# Patient Record
Sex: Female | Born: 1935 | Hispanic: No
Health system: Southern US, Community
[De-identification: ages and names within clinical notes are randomized; demographics above are authoritative.]

## PROBLEM LIST (undated history)

## (undated) DIAGNOSIS — D649 Anemia, unspecified: Secondary | ICD-10-CM

## (undated) DIAGNOSIS — M1990 Unspecified osteoarthritis, unspecified site: Secondary | ICD-10-CM

## (undated) DIAGNOSIS — J189 Pneumonia, unspecified organism: Secondary | ICD-10-CM

## (undated) DIAGNOSIS — J449 Chronic obstructive pulmonary disease, unspecified: Secondary | ICD-10-CM

## (undated) DIAGNOSIS — F419 Anxiety disorder, unspecified: Secondary | ICD-10-CM

## (undated) DIAGNOSIS — E785 Hyperlipidemia, unspecified: Secondary | ICD-10-CM

## (undated) DIAGNOSIS — F09 Unspecified mental disorder due to known physiological condition: Secondary | ICD-10-CM

---

## 2004-01-03 ENCOUNTER — Emergency Department (HOSPITAL_COMMUNITY): Admission: EM | Admit: 2004-01-03 | Discharge: 2004-01-03 | Payer: Self-pay | Admitting: Emergency Medicine

## 2005-07-17 ENCOUNTER — Encounter: Payer: Self-pay | Admitting: Emergency Medicine

## 2005-07-18 ENCOUNTER — Inpatient Hospital Stay (HOSPITAL_COMMUNITY): Admission: EM | Admit: 2005-07-18 | Discharge: 2005-07-23 | Payer: Self-pay | Admitting: Internal Medicine

## 2005-09-13 ENCOUNTER — Emergency Department (HOSPITAL_COMMUNITY): Admission: EM | Admit: 2005-09-13 | Discharge: 2005-09-14 | Payer: Self-pay | Admitting: Emergency Medicine

## 2006-05-07 ENCOUNTER — Inpatient Hospital Stay (HOSPITAL_COMMUNITY): Admission: EM | Admit: 2006-05-07 | Discharge: 2006-05-15 | Payer: Self-pay | Admitting: Emergency Medicine

## 2006-05-09 ENCOUNTER — Encounter (INDEPENDENT_AMBULATORY_CARE_PROVIDER_SITE_OTHER): Payer: Self-pay | Admitting: Interventional Cardiology

## 2007-09-12 ENCOUNTER — Ambulatory Visit: Payer: Self-pay | Admitting: Surgery

## 2007-09-12 ENCOUNTER — Emergency Department (HOSPITAL_COMMUNITY): Admission: EM | Admit: 2007-09-12 | Discharge: 2007-09-12 | Payer: Self-pay | Admitting: Emergency Medicine

## 2007-09-12 ENCOUNTER — Encounter (INDEPENDENT_AMBULATORY_CARE_PROVIDER_SITE_OTHER): Payer: Self-pay | Admitting: Emergency Medicine

## 2010-01-22 ENCOUNTER — Inpatient Hospital Stay (HOSPITAL_COMMUNITY): Admission: EM | Admit: 2010-01-22 | Discharge: 2010-01-24 | Payer: Self-pay | Admitting: Emergency Medicine

## 2011-02-12 LAB — COMPREHENSIVE METABOLIC PANEL
ALT: 20 U/L (ref 0–35)
AST: 36 U/L (ref 0–37)
Albumin: 2.5 g/dL — ABNORMAL LOW (ref 3.5–5.2)
Alkaline Phosphatase: 66 U/L (ref 39–117)
BUN: 19 mg/dL (ref 6–23)
CO2: 25 mEq/L (ref 19–32)
Calcium: 8.4 mg/dL (ref 8.4–10.5)
Chloride: 111 mEq/L (ref 96–112)
Creatinine, Ser: 0.82 mg/dL (ref 0.4–1.2)
GFR calc Af Amer: >60 mL/min (ref >60)
GFR calc non Af Amer: >60 mL/min (ref >60)
Glucose, Bld: 101 mg/dL — ABNORMAL HIGH (ref 70–99)
Potassium: 2.9 mEq/L — ABNORMAL LOW (ref 3.5–5.1)
Sodium: 142 mEq/L (ref 135–145)
Total Bilirubin: 0.6 mg/dL (ref 0.3–1.2)
Total Protein: 5.9 g/dL — ABNORMAL LOW (ref 6.0–8.3)

## 2011-02-12 LAB — CULTURE, BLOOD (ROUTINE X 2)
Culture: NO GROWTH
Culture: NO GROWTH

## 2011-02-12 LAB — DIFFERENTIAL
Basophils Absolute: 0 10*3/uL (ref 0.0–0.1)
Basophils Absolute: 0 10*3/uL (ref 0.0–0.1)
Basophils Relative: 0 % (ref 0–1)
Basophils Relative: 0 % (ref 0–1)
Eosinophils Absolute: 0 10*3/uL (ref 0.0–0.7)
Eosinophils Absolute: 0 10*3/uL (ref 0.0–0.7)
Eosinophils Relative: 0 % (ref 0–5)
Eosinophils Relative: 0 % (ref 0–5)
Lymphocytes Relative: 12 % (ref 12–46)
Lymphocytes Relative: 7 % — ABNORMAL LOW (ref 12–46)
Lymphs Abs: 0.7 10*3/uL (ref 0.7–4.0)
Lymphs Abs: 1 10*3/uL (ref 0.7–4.0)
Monocytes Absolute: 0.6 10*3/uL (ref 0.1–1.0)
Monocytes Absolute: 0.7 10*3/uL (ref 0.1–1.0)
Monocytes Relative: 7 % (ref 3–12)
Monocytes Relative: 8 % (ref 3–12)
Neutro Abs: 6.3 10*3/uL (ref 1.7–7.7)
Neutro Abs: 7.8 10*3/uL — ABNORMAL HIGH (ref 1.7–7.7)
Neutrophils Relative %: 80 % — ABNORMAL HIGH (ref 43–77)
Neutrophils Relative %: 85 % — ABNORMAL HIGH (ref 43–77)

## 2011-02-12 LAB — CBC
HCT: 31.3 % — ABNORMAL LOW (ref 36.0–46.0)
HCT: 32.3 % — ABNORMAL LOW (ref 36.0–46.0)
Hemoglobin: 10.5 g/dL — ABNORMAL LOW (ref 12.0–15.0)
Hemoglobin: 10.9 g/dL — ABNORMAL LOW (ref 12.0–15.0)
Hemoglobin: 11.4 g/dL — ABNORMAL LOW (ref 12.0–15.0)
MCHC: 34.5 g/dL (ref 30.0–36.0)
MCHC: 34.7 g/dL (ref 30.0–36.0)
MCHC: 35.1 g/dL (ref 30.0–36.0)
MCV: 94.6 fL (ref 78.0–100.0)
MCV: 95.1 fL (ref 78.0–100.0)
MCV: 95.1 fL (ref 78.0–100.0)
Platelets: 184 10*3/uL (ref 150–400)
Platelets: 201 10*3/uL (ref 150–400)
RBC: 3.21 MIL/uL — ABNORMAL LOW (ref 3.87–5.11)
RBC: 3.3 MIL/uL — ABNORMAL LOW (ref 3.87–5.11)
RBC: 3.42 MIL/uL — ABNORMAL LOW (ref 3.87–5.11)
RDW: 12.9 % (ref 11.5–15.5)
RDW: 13.1 % (ref 11.5–15.5)
WBC: 8 10*3/uL (ref 4.0–10.5)
WBC: 9.2 10*3/uL (ref 4.0–10.5)

## 2011-02-12 LAB — BASIC METABOLIC PANEL
BUN: 29 mg/dL — ABNORMAL HIGH (ref 6–23)
CO2: 24 mEq/L (ref 19–32)
CO2: 25 mEq/L (ref 19–32)
Calcium: 8.6 mg/dL (ref 8.4–10.5)
Chloride: 105 mEq/L (ref 96–112)
Chloride: 109 mEq/L (ref 96–112)
Creatinine, Ser: 0.94 mg/dL (ref 0.4–1.2)
GFR calc Af Amer: >60 mL/min (ref >60)
GFR calc Af Amer: >60 mL/min (ref >60)
GFR calc non Af Amer: 57 mL/min — ABNORMAL LOW (ref >60)
Glucose, Bld: 138 mg/dL — ABNORMAL HIGH (ref 70–99)
Potassium: 3.8 mEq/L (ref 3.5–5.1)
Sodium: 138 mEq/L (ref 135–145)
Sodium: 139 mEq/L (ref 135–145)

## 2011-02-12 LAB — LACTIC ACID, PLASMA: Lactic Acid, Venous: 1.1 mmol/L (ref 0.5–2.2)

## 2011-02-12 LAB — TSH: TSH: 1.661 u[IU]/mL (ref 0.350–4.500)

## 2011-02-12 LAB — PHOSPHORUS: Phosphorus: 2.2 mg/dL — ABNORMAL LOW (ref 2.3–4.6)

## 2011-02-12 LAB — MAGNESIUM: Magnesium: 2.1 mg/dL (ref 1.5–2.5)

## 2011-04-07 NOTE — H&P (Signed)
[name], [name]                 ACCOUNT NO.:  [id number]   MEDICAL RECORD NO.:  [id number]          PATIENT TYPE:  EMS   LOCATION:  ED                           FACILITY:  [facility]   PHYSICIAN:  Deirdre Peer. Polite, M.D. DATE OF BIRTH:  1922-10-15   DATE OF ADMISSION:  07/17/2005  DATE OF DISCHARGE:                                HISTORY & PHYSICAL   CHIEF COMPLAINT:  Per family, advanced dementia.   HISTORY OF PRESENT ILLNESS:  An 77 year old female with a history of  advanced dementia and osteoarthritis, who was brought to the ED by  paramedics after being found wandering.  The patient was without any obvious  injury.  In the ED, the patient was evaluated.  Had screening labs.  CBC,  point-of-care, CMET, and UA within normal limits.  The patient had a CT of  the head with questionable early evidence of left occipital CVA.  The  patient is unable to give any meaningful information secondary to any  advanced dementia; however, denies any fever, chills, nausea, vomiting.  No  diarrhea.  No constipation.  No chest pain.  No shortness of breath.  The  patient's daughter is present and confirms that the patient, other than her  advanced dementia, has not had any of the above complaints; however, the  patient's dementia seems to be advancing rather rapidly, and she currently  has poor safety awareness.  As stated today, has been found wandering.  This  is becoming more apparent and is happening with increasing frequency.  The  patient's daughter states that she is unable to care for her at home, and  will like assistance with placement secondary to her advanced dementia and  poor safety awareness.   PAST MEDICAL HISTORY:  As stated above.   MEDICATIONS:  None.   SOCIAL HISTORY:  Negative for tobacco, alcohol, or drugs.   PAST SURGICAL HISTORY:  Questionable broken wrist in the distant past.  Hysterectomy in the distant past.  Questionable for dysfunction uterine  bleeding.   ALLERGIES:  No known drug allergies.   FAMILY HISTORY:  Unavailable.   REVIEW OF SYSTEMS:  Reported as negative, as stated above in the HPI.   PHYSICAL EXAMINATION:  GENERAL:  The patient is alert in no apparent  distress.  Pleasantly confused.  VITAL SIGNS:  Temperature 98.5, BP 126/72, pulse 75, respiratory rate 18.  HEENT:  Within normal limits.  CHEST:  Clear to auscultation.  CARDIOVASCULAR:  Regular.  No S3.  ABDOMEN:  Soft and nontender.  No hepatosplenomegaly.  EXTREMITIES:  No edema.  Pulses 2+.  NEURO:  Grossly moves all extremities.  Extraocular muscles are intact.  Motor 5/5.  Gait not tested.  As stated, grossly intact.   DATA:  CBC within normal limits.  Point-of-care enzymes within normal  limits.  CMET within normal limits.   CT of the head:  Early evidence of left occipital CVA with microvascular  disease.  This is a questionable result of early evidence of left occipital  CVA.   ASSESSMENT:  1.  Advanced dementia.  2.  Poor  safety awareness.  3.  Osteoarthritis.  4.  Abnormal CT scan of the head, questionable early occipital      cerebrovascular accident and microvascular disease.   Recommend patient be admitted to a medicine floor bed.  Will send labs for  reversible causes of dementia and will start plans for placement.  As the  patient has an abnormal CT showing questionable early evidence of occipital  CVA.  Will obtain an MRI/MRA of the brain.  Will make further  recommendations after review of the above studies.      Deirdre Peer. Polite, M.D.  Electronically Signed     RDP/MEDQ  D:  07/17/2005  T:  07/17/2005  Job:  [id number]

## 2011-04-07 NOTE — Discharge Summary (Signed)
NAMESHANEQUE, MERKLE                 ACCOUNT NO.:  [id number]   MEDICAL RECORD NO.:  [id number]          PATIENT TYPE:  INP   LOCATION:  6703                         FACILITY:  [facility]   PHYSICIAN:  Melissa L. Ladona Ridgel, MD  DATE OF BIRTH:  1922-06-04   DATE OF ADMISSION:  07/18/2005  DATE OF DISCHARGE:  07/22/2005                                 DISCHARGE SUMMARY   DISCHARGING DIAGNOSES:  1.  End-stage dementia with wandering.  The patient obviously is not able to      care for herself at home or remain unsupervised for any length of time.      We therefore have made arrangements for transfer to a secured facility      for monitoring and care on a 24-hour basis.  2.  Osteoarthritis.  The patient has made no complaints about her hands or      other joints, although today she does state that she has mild hip      discomfort.  I suspect that this is possibly more related to      constipation as the patient cannot express fully what she means by      discomfort.  We will attempt to assess the situation and correct any      obvious dysfunction.   MEDICATIONS AT THE TIME OF DISCHARGE:  1.  Aspirin 81 mg p.o. daily.  2.  Tylenol 650 mg p.o. q.6h. p.r.n.  3.  Haldol 2.5 mg IV or IM or p.o. q.4h. p.r.n. for agitation.   HISTORY OF PRESENT ILLNESS:  This is an 77 year old white female with a  history of advanced dementia and osteoarthritis, who was found wandering in  the community unsupervised.  The patient had been living alone with family  assistance, but at this time the family can no longer care for her safely at  home.   She was brought to the emergency room, evaluated for exacerbating factors  which may have caused her to wander, namely a head CT was completed as was  an MRI/MRA to assure that she had not had any stroke, and no obvious stroke  was located.  A chest x-ray was completed, which showed no active disease.  A urinalysis was obtained with no obvious signs for infection.  A  dementia  laboratory panel was done with no obvious abnormalities being noted.  The  patient tolerated the hospital stay well without any complicating factors.  It appears that her only difficulty is very advanced end-stage dementia.   PHYSICAL EXAMINATION:  VITAL SIGNS:  On the day prior to discharge, her  temperature was 97.4, blood pressure 141/75, pulse 88, respirations 16, O2  saturation 96%.  GENERAL:  This is a well-developed, well-nourished white female in no acute  distress.  HEENT:  Pupils equal, round, and reactive to light.  Extraocular muscles are  intact.  Mucous membranes are moist.  NECK:  Supple.  There is no JVD, no lymph nodes and no carotid bruit.  CHEST:  Clear to auscultation.  There are no rhonchi, rales or wheezes.  CARDIOVASCULAR:  Regular rate  and rhythm, positive S1, S2, no S3, S4, no  murmurs, rubs or gallops.  ABDOMEN:  Soft, nontender, nondistended, with positive bowel sounds.  EXTREMITIES:  No clubbing, cyanosis, or edema.  NEUROLOGIC:  The patient is awake but not oriented to time, place or person.  She does like to participate in folding of towels and is easily distracted  with minor activities with her hands.  Her power is 5/5.  DTRs are 2+.  She  does have mild to moderate gait dysfunction that our physical therapist felt  would be assisted with a rolling walker.   As stated, pertinent laboratory values are as follows:  TSH is 1.852.  RPR  is nonreactive.  Folic acid is 09.8.  B12 is 285.  Urinalysis is negative.  Cardiac markers were negative.  On admission, her sodium was 142, potassium  4.3, chloride 104, CO2 30, BUN 27, creatinine 0.8.  LFTs are within normal  limits.  Her CBC was 6.0 with a hemoglobin of 12.2 and hematocrit 35.8,  platelets of 291.   At this time, the patient is deemed stable for transfer to a locked  dementia unit if available, and she may be transferred when a bed becomes  available.      Melissa L. Ladona Ridgel, MD   Electronically Signed     MLT/MEDQ  D:  07/21/2005  T:  07/21/2005  Job:  [id number]

## 2011-04-07 NOTE — Discharge Summary (Signed)
[name], [name]                 ACCOUNT NO.:  [id number]   MEDICAL RECORD NO.:  [id number]          PATIENT TYPE:  INP   LOCATION:  5741                         FACILITY:  [facility]   PHYSICIAN:  Ollen Gross, M.D.    DATE OF BIRTH:  [birth year]   DATE OF ADMISSION:  05/07/2006  DATE OF DISCHARGE:  05/14/2006                                 DISCHARGE SUMMARY   ADMISSION DIAGNOSES:  1.  Displaced left femoral neck fracture.  2.  End-stage dementia.  3.  Osteoarthritis.  4.  History of risk fracture.  5.  History of hypercholesterolemia.  6.  History of leg fracture.   DISCHARGE DIAGNOSES:  1.  Displaced left femoral neck fracture status post left hip      hemiarthroplasty.  2.  Postoperative blood loss anemia.  3.  Status post transfusion without sequelae.  4.  Postoperative hyponatremia.  5.  End-stage dementia.  6.  Osteoarthritis.  7.  History of risk fracture.  8.  History of hypercholesterolemia.  9.  History of leg fracture.   PROCEDURE:  May 09, 2006, left hip hemiarthroplasty.   SURGEON:  Ollen Gross, M.D.   ASSISTANT:  Alexzandrew L. Julien Girt, P.A.   ANESTHESIA:  General.   CONSULTATIONS:  [facility], Dr. Derenda Mis.   BRIEF HISTORY:  [name] is an 77 year old female who sustained a fall in a  nursing home prior to admission. Unfortunately, she got up, injuring her  left hip. She was brought to [facility] where x-rays showed a femoral neck  fracture. The patient was seen and admitted. Findings were discussed with  the family. She was admitted for bedrest and preoperative evaluation.   LABORATORY DATA:  CBC on admission:  Hemoglobin 12.5, hematocrit 35.5, white  cell count 5.9, differential within normal limits. Hemoglobin dropped down  to 10.9 to 10.0 on the date of surgery, drifted down to 9.1, then down to  8.5; given 2 units of blood. Followup hemoglobin 11.4. PT/PTT on admission  11.4 and 28, respectively; INR 0.8. Chem panel on admission:   Elevated BUN  of 25, low albumin of 3.4, elevated glucose 153; remaining Chem panel within  normal limits. Serial BMETs were followed; sodium dropped from 136 to 133,  stabilizing 132; glucose came down to 106. Cardiac enzymes taken, first set  on June 20:  CK 159, CK-MB 2.8, relative index 1.8, troponin 0.22; second  set taken on June 20:  CK 143, CK-MB 2.6, relative index 1.1, troponin 0.18;  third set on June 20:  CK 133, CK-MB 2.2, relative 1.7, troponin continues  to improve to 0.15. Preoperative UA:  Many bacteria, rare epithelial, small  leukocyte esterase, 3 to 6 white cells, 0 to 2 red cells, rare yeast. Urine  culture:  Escherichia coli, pan sensitive.   X-rays:  Portable chest May 07, 2006:  No active cardiopulmonary disease,  marked chronic interstitial changes. Left hip films:  Impacted mildly  displaced subcapital left femoral neck fracture. EKG May 09, 2006:  Sinus  rhythm, possible premature atrial complexes with premature ventricular  complexes , left axis  deviation, low-voltage QRS. When compared with  previous, no change, confirmed by Dr. Olga Millers. Other EKG May 07, 2006, sinus rhythm with sinus arrhythmia with occasional premature  ventricular complexes, left axis deviation, complete right bundle branch  block .   HOSPITAL COURSE:  The patient was admitted to [facility], placed on  bedrest. Hospital consult called in to [facility]. The patient was  seen and evaluated. She was placed in Buck's traction for comfort. The  patient had UTI which was found during the hospital course and was placed on  empiric antibiotics. Once the patient had been seen and cleared, she was  preopped, and then she was taken to the operating room on May 09, 2006  where she underwent the above stated procedure without difficulty. P.o. and  IV analgesics were placed. She was continued on postoperative IV antibiotics  for 24 hours. By day 1, she was doing pretty well,  resting comfortably,  easily arousable. Hemoglobin was 10.9. No drain was used. She did have  cardiac enzymes taken postoperatively which had elevation in the troponin.  However, the cardiac markers were all within normal limits. The troponins  did improve with each serial set. By day 2, the patient was resting  comfortably, doing well, dressing changed. Incision was healing well.  Supplemented potassium for borderline hypokalemia. IVs were heplocked.  Dementia was stable. She was using her Seroquel. She continued on Cipro for  the UTI. Foley was removed. Over the weekend, she did well.  No obvious  distress. Pleasantly confused. She had an episode of mild hypotension. Lasix  was held, along with metoprolol, given a slight bolus. Hemoglobin was noted  to be low on Sunday of 8.5. She was given 2 units of blood, tolerated the  transfusion well. Hemoglobin came back up. She was up to 11.4 after 2 units.  It was felt on Monday, June 25, that she was more than likely ready for  discharge. Discharge planning were working with the patient. They were  trying to contact the family for bed offers and wanting on bed placement.   DISCHARGE PLAN:  1.  The patient's possible tentative discharge on May 14, 2006.  2.  Please see above.   DISCHARGE MEDICATIONS:  Current medications include:  1.  Lasix 20 mg p.o. daily.  2.  Seroquel 25 mg p.o. nightly.  3.  Lopressor 12.5 mg p.o. b.i.d.  4.  Altace 2.5 mg p.o. daily.  5.  Multivitamin daily.  6.  Cipro 250 mg p.o. b.i.d.; continue until May 19, 2006 and then      discontinue Cipro.  7.  Ativan 0.5 mg p.o. q.8h. p.r.n. agitation.  8.  Restoril 15 mg p.o. nightly p.r.n. sleep.  9.  Vicodin 5 mg 1 or 2 every 4 to 6 hours as needed for pain.  10. Robaxin 500 mg p.o. q.6h. p.r.n. spasm.  11. Zofran 4 mg p.o. q.6h. p.r.n. nausea.  12. Senokot S p.o. nightly p.r.n. 13. Lovenox 40 mg subcu daily for 6 more days following discharge and then       discontinue.   DIET:  As tolerated.   ACTIVITY:  She can be weight bearing as tolerated to the left lower  extremity, hip precautions. She only needs a knee immobilizer in bed at  night. May have the knee immobilized or off when she is up walking or  sitting in a chair. Up out of bed minimal b.i.d. Gait training, ambulation,  ADLs, PT, and  OT. Needs to follow up in the office 2 weeks from surgery.  Please contact the office at [phone] to arrange an appointment time and  transfer of the patient.   DISPOSITION:  Pending bed offers.   CONDITION ON DISCHARGE:  Pending until final disposition .      Alexzandrew L. Julien Girt, P.A.      Ollen Gross, M.D.  Electronically Signed    ALP/MEDQ  D:  05/14/2006  T:  05/14/2006  Job:  [id number]   cc:   Bryan Lemma. Manus Gunning, M.D.  Fax: [phone]   Melissa L. Ladona Ridgel, MD

## 2011-04-07 NOTE — Discharge Summary (Signed)
[name], HOHEISEL                 ACCOUNT NO.:  [id number]   MEDICAL RECORD NO.:  [id number]          PATIENT TYPE:  INP   LOCATION:  5741                         FACILITY:  [facility]   PHYSICIAN:  Melissa L. Ladona Ridgel, MD  DATE OF BIRTH:  [birth year]   DATE OF ADMISSION:  05/07/2006  DATE OF DISCHARGE:  05/14/2006                                 DISCHARGE SUMMARY   ADDENDUM:  Please see the previously-dictated discharge summary by the  physician assistant for Dr. Lequita Halt and note the following changes, which  have been handwritten on the discharge copy:   1.  The patient's Lasix is 20 mg p.o. daily.  I would hold this if her blood      pressure is less than 100 systolic.  2.  Her Lopressor, I would discontinue this as her blood pressure remains      too low to support its therapy.  3.  The Altace 2.5 mg p.o. daily, I would hold this for systolic blood      pressures less than 100 and if she continues to be with low blood      pressures in the low 100s, I would discontinue it altogether.   I will contact Dr. Lequita Halt and let him know of the changes, and at this time  the patient is stable status post 2 units of packed red blood cells with a  hemoglobin of 11.4 today.  It is okay with me if she discharges to the  skilled nursing facility as long as these changes to her medications are  noted.      Melissa L. Ladona Ridgel, MD  Electronically Signed     MLT/MEDQ  D:  05/14/2006  T:  05/14/2006  Job:  [id number]   cc:   Bryan Lemma. Manus Gunning, M.D.  Fax: [phone]   Ollen Gross, M.D.  Fax: [phone]

## 2011-04-07 NOTE — Op Note (Signed)
NAMEDELLIA, DONNELLY                 ACCOUNT NO.:  [id number]   MEDICAL RECORD NO.:  [id number]          PATIENT TYPE:  INP   LOCATION:  5741                         FACILITY:  [facility]   PHYSICIAN:  Ollen Gross, M.D.    DATE OF BIRTH:  May 11, 1922   DATE OF PROCEDURE:  05/09/2006  DATE OF DISCHARGE:                                 OPERATIVE REPORT   PREOPERATIVE DIAGNOSIS:  Displaced left femoral neck fracture.   POSTOP DIAGNOSIS:  Displaced left femoral neck fracture.   PROCEDURE:  Left hip hemiarthroplasty.   SURGEON:  Ollen Gross, M.D.   ASSISTANT:  Alexzandrew L. Julien Girt, P.A.   ANESTHESIA:  General.   ESTIMATED BLOOD LOSS:  100   DRAINS:  None.   COMPLICATIONS:  None.   CONDITION:  Stable to respiratory rate.   INDICATIONS FOR PROCEDURE:  Ms. [name] is an 77 year old female who had fall  in nursing home 2 days ago sustaining a displaced left femoral neck  fracture.  She presents now for left hip hemiarthroplasty.   PROCEDURE IN DETAIL:  After the successful initiation of general anesthetic,  the patient's placed a right lateral decubitus position with the left side  up and held with the hip positioner.  Left lower extremity isolated from her  perineum with plastic drapes and prepped and draped in usual sterile  fashion.  Short posterolateral incision made with 10 blade through  subcutaneous tissue to the level of the fascia lata which was incised in  line with the skin incision.  Sciatic nerve is palpated and protected and  short rotators isolated off the femur.  Capsulotomy was performed and  fracture hematoma identified.  Femoral head is identified and removed.  Measured 47 mm in diameter.  This was a high subcapital fracture.  I took an  oscillating saw and then resected the femoral neck at the appropriate level  and with the appropriate inclination.  We then used the canal finder to gain  access to the femoral canal and thoroughly irrigated the canal.  We  started  broaching at size 1 for the Summit basic fracture stem.  First up to a size  5 for a size 5 Press-Fit stem.  We had excellent torsional and axial  stability with this.  The broaches removed and the size 5 Summit basic Press-  Fit implant was impacted into the femoral canal with excellent torsional and  axial stability.  A trial 1.5 head with a 47 bipolar is placed.  Hip was  reduced with great stability throughout.  Excellent suction fit with the  bipolar component into the acetabulum.  The trials were removed and the  permanent 28 plus 1.5 head and the permanent 47 bipolar were placed.  The  hip was reduced with great stability.  Wound was copiously irrigated with  saline solution and rotators and capsule reattached to the femur with  Ethibond suture.  Fascia lata was closed with #1 Vicryl, subcu closed with #1-0 and #2-0  Vicryl, subcuticular running 4-0 Monocryl.  Incisions cleaned and dried and  Steri-Strips and bulky sterile dressing applied.  She is placed in a knee  immobilizer, awakened, transferred to recovery in stable condition.      Ollen Gross, M.D.  Electronically Signed     FA/MEDQ  D:  05/09/2006  T:  05/10/2006  Job:  [id number]

## 2011-05-25 ENCOUNTER — Emergency Department (HOSPITAL_COMMUNITY): Payer: Medicare HMO

## 2011-05-25 ENCOUNTER — Inpatient Hospital Stay (HOSPITAL_COMMUNITY): Payer: Medicare HMO

## 2011-05-25 ENCOUNTER — Inpatient Hospital Stay (HOSPITAL_COMMUNITY)
Admission: EM | Admit: 2011-05-25 | Discharge: 2011-05-31 | DRG: 470 | Disposition: A | Discharge status: Skilled Nursing Facility | Payer: Medicare HMO | Attending: Family Medicine | Admitting: Family Medicine

## 2011-05-25 DIAGNOSIS — E785 Hyperlipidemia, unspecified: Secondary | ICD-10-CM | POA: Diagnosis present

## 2011-05-25 DIAGNOSIS — G309 Alzheimer's disease, unspecified: Secondary | ICD-10-CM | POA: Diagnosis present

## 2011-05-25 DIAGNOSIS — F0280 Dementia in other diseases classified elsewhere without behavioral disturbance: Secondary | ICD-10-CM | POA: Diagnosis present

## 2011-05-25 DIAGNOSIS — A498 Other bacterial infections of unspecified site: Secondary | ICD-10-CM | POA: Diagnosis not present

## 2011-05-25 DIAGNOSIS — F411 Generalized anxiety disorder: Secondary | ICD-10-CM | POA: Diagnosis present

## 2011-05-25 DIAGNOSIS — S72009A Fracture of unspecified part of neck of unspecified femur, initial encounter for closed fracture: Principal | ICD-10-CM | POA: Diagnosis present

## 2011-05-25 DIAGNOSIS — D649 Anemia, unspecified: Secondary | ICD-10-CM | POA: Diagnosis present

## 2011-05-25 DIAGNOSIS — M1990 Unspecified osteoarthritis, unspecified site: Secondary | ICD-10-CM | POA: Diagnosis present

## 2011-05-25 DIAGNOSIS — W19XXXA Unspecified fall, initial encounter: Secondary | ICD-10-CM | POA: Diagnosis present

## 2011-05-25 DIAGNOSIS — N390 Urinary tract infection, site not specified: Secondary | ICD-10-CM | POA: Diagnosis not present

## 2011-05-25 DIAGNOSIS — E860 Dehydration: Secondary | ICD-10-CM | POA: Diagnosis present

## 2011-05-25 DIAGNOSIS — Y9210 Unspecified residential institution as the place of occurrence of the external cause: Secondary | ICD-10-CM | POA: Diagnosis present

## 2011-05-25 LAB — URINALYSIS, ROUTINE W REFLEX MICROSCOPIC
Bilirubin Urine: NEGATIVE
Glucose, UA: NEGATIVE mg/dL
Hgb urine dipstick: NEGATIVE
Ketones, ur: NEGATIVE mg/dL
Nitrite: NEGATIVE
Protein, ur: NEGATIVE mg/dL
Specific Gravity, Urine: 1.024 (ref 1.005–1.030)
Urobilinogen, UA: 0.2 mg/dL (ref 0.0–1.0)
pH: 5 (ref 5.0–8.0)

## 2011-05-25 LAB — CBC
HCT: 37.3 % (ref 36.0–46.0)
HCT: 35.2 % — ABNORMAL LOW (ref 36.0–46.0)
Hemoglobin: 12.9 g/dL (ref 12.0–15.0)
Hemoglobin: 12.1 g/dL (ref 12.0–15.0)
MCH: 31.7 pg (ref 26.0–34.0)
MCH: 31.3 pg (ref 26.0–34.0)
MCHC: 34.6 g/dL (ref 30.0–36.0)
MCHC: 34.4 g/dL (ref 30.0–36.0)
MCV: 91.6 fL (ref 78.0–100.0)
MCV: 91.2 fL (ref 78.0–100.0)
Platelets: 224 10*3/uL (ref 150–400)
RBC: 4.07 MIL/uL (ref 3.87–5.11)
RDW: 13 % (ref 11.5–15.5)
WBC: 7.7 10*3/uL (ref 4.0–10.5)

## 2011-05-25 LAB — DIFFERENTIAL
Basophils Absolute: 0 10*3/uL (ref 0.0–0.1)
Basophils Relative: 0 % (ref 0–1)
Eosinophils Absolute: 0.1 10*3/uL (ref 0.0–0.7)
Eosinophils Relative: 1 % (ref 0–5)
Lymphocytes Relative: 15 % (ref 12–46)
Lymphocytes Relative: 5 % — ABNORMAL LOW (ref 12–46)
Lymphs Abs: 1.1 10*3/uL (ref 0.7–4.0)
Lymphs Abs: 0.5 10*3/uL — ABNORMAL LOW (ref 0.7–4.0)
Monocytes Absolute: 0.5 10*3/uL (ref 0.1–1.0)
Monocytes Absolute: 0.6 10*3/uL (ref 0.1–1.0)
Monocytes Relative: 7 % (ref 3–12)
Monocytes Relative: 6 % (ref 3–12)
Neutro Abs: 6 10*3/uL (ref 1.7–7.7)
Neutro Abs: 8.8 10*3/uL — ABNORMAL HIGH (ref 1.7–7.7)
Neutrophils Relative %: 78 % — ABNORMAL HIGH (ref 43–77)

## 2011-05-25 LAB — BASIC METABOLIC PANEL
BUN: 27 mg/dL — ABNORMAL HIGH (ref 6–23)
GFR calc non Af Amer: >60 mL/min (ref >60)
Glucose, Bld: 145 mg/dL — ABNORMAL HIGH (ref 70–99)
Potassium: 3.9 mEq/L (ref 3.5–5.1)

## 2011-05-25 LAB — APTT: aPTT: 32 s (ref 24–37)

## 2011-05-25 LAB — URINE MICROSCOPIC-ADD ON

## 2011-05-25 LAB — PROTIME-INR: Prothrombin Time: 13.2 seconds (ref 11.6–15.2)

## 2011-05-26 LAB — COMPREHENSIVE METABOLIC PANEL
Albumin: 2.7 g/dL — ABNORMAL LOW (ref 3.5–5.2)
BUN: 23 mg/dL (ref 6–23)
Chloride: 112 mEq/L (ref 96–112)
Creatinine, Ser: 0.65 mg/dL (ref 0.50–1.10)
GFR calc Af Amer: >60 mL/min (ref >60)
Glucose, Bld: 113 mg/dL — ABNORMAL HIGH (ref 70–99)
Total Bilirubin: 0.4 mg/dL (ref 0.3–1.2)
Total Protein: 5.5 g/dL — ABNORMAL LOW (ref 6.0–8.3)

## 2011-05-26 LAB — CBC
HCT: 27.6 % — ABNORMAL LOW (ref 36.0–46.0)
Hemoglobin: 9.4 g/dL — ABNORMAL LOW (ref 12.0–15.0)
MCH: 31.3 pg (ref 26.0–34.0)
MCHC: 34.1 g/dL (ref 30.0–36.0)
MCV: 92 fL (ref 78.0–100.0)
Platelets: 163 10*3/uL (ref 150–400)
RBC: 3 MIL/uL — ABNORMAL LOW (ref 3.87–5.11)
RDW: 13.2 % (ref 11.5–15.5)
WBC: 6.1 10*3/uL (ref 4.0–10.5)

## 2011-05-26 NOTE — H&P (Signed)
NAMEMarland Kitchen  NEYLAN, [name] NO.:  [id number]  MEDICAL RECORD NO.:  [id number]  LOCATION:  5021                         FACILITY:  [facility]  PHYSICIAN:  Standley Dakins, MD   DATE OF BIRTH:  August 05, 1922  DATE OF ADMISSION:  05/25/2011 DATE OF DISCHARGE:                             HISTORY & PHYSICAL   PRIMARY CARE PHYSICIAN:  The attending physician at [facility], [address] [place], [place].  HISTORIAN:  Emergency room physician and records from NH.  CHIEF COMPLAINT:  Fractured right femur.  HISTORY OF PRESENT ILLNESS:  This patient is a severely demented 77 year old female resident of the [facility], who was sent to the emergency department with a chief complaint of fall.  The nursing home report provided information that the patient suffered an unwitnessed fall and appear to be grabbing her right thigh.  She unfortunately is completely nonverbal and so no history can be obtained from the patient.  The fall apparently occurred today.  She was at the nursing home.  There was no associated loss of consciousness reported. Symptoms have been described as mild according to witness reports from the nursing home.  The patient was seen in the emergency department and found to have a fractured right femur.  The patient has a past medical history significant for hyperlipidemia, osteoarthritis, anemia, dementia, and pneumonia.  Hospital admission was requested for further treatment and orthopedic consultation.  The patient was seen by the orthopedist and they plan on taking her to the operating room in the near future for treatment.  PAST MEDICAL HISTORY: 1. Anxiety disorder. 2. Anemia. 3. Dementia. 4. Dermatitis. 5. Osteoarthritis. 6. Hyperlipidemia. 7. History of previous hip fracture. 8. History of dysphagia. 9. Cerebrovascular disease and microvascular disease.  MEDICATIONS:  From home, 1. Promethazine 25 mg one p.o. q.6h.  p.r.n. 2. Ativan 0.5 mg one p.o. every 8 hours as needed. 3. Hydrocodone/acetaminophen 5/500 one p.o. q.6h. p.r.n. 4. Seroquel 25 mg one p.o. daily at bedtime. 5. Vitamin C 500 mg one p.o. twice daily. 6. Healthy Shakes 1 can twice daily. 7. Multivitamins one p.o. daily. 8. Furosemide 20 mg one p.o. daily.  ALLERGIES:  No known drug allergies.  FAMILY HISTORY:  Unable to obtain.  SOCIAL HISTORY:  This patient is currently a resident of the [facility] and requires assistance with all of her activities of daily living.  The patient is nonverbal and severely demented.  No history of tobacco, alcohol, or recreational drug use reported.  REVIEW OF SYSTEMS:  Significant for pain in the right femur, confusion, dry mouth, otherwise unable to obtain because of the patient's severe dementia.  PHYSICAL EXAMINATION:  VITAL SIGNS:  Temperature 98.1, pulse 91, respirations 20, blood pressure 147/71, pulse ox 97% on room air. GENERAL:  This is an elderly female.  She is lying in bed.  She is in no apparent distress.  She is nonverbal, but nongrimacing. HEENT:  Normocephalic, atraumatic.  Dry mucous membranes noted. NECK:  Supple.  No lymphadenopathy or JVD.  Thyroid soft.  No nodules or masses palpated. LUNGS:  Bilateral breath sounds, clear to auscultation.  No crackles, wheezes, or rhonchi heard. CARDIAC:  Normal S1 and S2 sounds without murmurs, rubs, or gallops. ABDOMEN:  Soft, nondistended, nontender.  No masses palpated.  No hepatosplenomegaly, guarding, or rebound tenderness noted. EXTREMITIES:  The patient is in traction in the right lower extremity. No edema noted on the left lower extremity or the right lower extremity. NEUROLOGIC:  No focal deficits. PSYCHIATRIC:  The patient has severe dementia and is nonverbal at this time.  LABORATORY DATA:  Urinalysis reveals 11-20 white blood cells per high- power field and many bacteria.  Cloudy urine noticed.  Type and  screen positive.  Sodium 143, potassium 3.9, chloride 106, bicarb 23, BUN 27, creatinine 0.82, calcium 9.7.  PTT 32, PT 13.2, INR 0.98.  White blood cell count 7.7, hemoglobin 12.9, hematocrit 37.3, platelet count 224.  IMAGING STUDIES:  Chest x-ray, no acute findings noted and stable exam. Right femur x-ray reveals a right femoral neck fracture.  IMPRESSION: 1. Acute right femoral neck fracture. 2. Dehydration. 3. Urinary tract infection. 4. Severe advanced dementia. 5. History of hyperlipidemia. 6. Anxiety disorder.  PLAN: 1. The patient is going to be admitted into the hospital for further     management.  I talked with the orthopedic specialist, who may be     taking her to the OR in the next several hours, if not today then     tomorrow. 2. Encourage IV fluids and treat with Rocephin for the urinary tract     infection, culture urine. 3. Monitor hemoglobin closely. 4. Hydrocodone for pain as needed. 5. Ativan for agitation, anxiety. 6. Please see orthopedic consultation for further information     regarding her surgery.     Standley Dakins, MD     CJ/MEDQ  D:  05/25/2011  T:  05/25/2011  Job:  [id number]  Electronically Signed by Standley Dakins  on 05/26/2011 06:04:35 PM

## 2011-05-27 LAB — CBC
HCT: 23.5 % — ABNORMAL LOW (ref 36.0–46.0)
MCH: 31.5 pg (ref 26.0–34.0)
MCV: 91.4 fL (ref 78.0–100.0)
Platelets: 129 10*3/uL — ABNORMAL LOW (ref 150–400)
RBC: 2.57 MIL/uL — ABNORMAL LOW (ref 3.87–5.11)
WBC: 6.5 10*3/uL (ref 4.0–10.5)

## 2011-05-27 LAB — BASIC METABOLIC PANEL
BUN: 15 mg/dL (ref 6–23)
CO2: 23 mEq/L (ref 19–32)
Calcium: 8 mg/dL — ABNORMAL LOW (ref 8.4–10.5)
Chloride: 110 mEq/L (ref 96–112)
Creatinine, Ser: 0.59 mg/dL (ref 0.50–1.10)
Glucose, Bld: 120 mg/dL — ABNORMAL HIGH (ref 70–99)

## 2011-05-27 LAB — URINE CULTURE

## 2011-05-28 LAB — CBC
MCH: 31.9 pg (ref 26.0–34.0)
Platelets: 132 10*3/uL — ABNORMAL LOW (ref 150–400)
RBC: 2.38 MIL/uL — ABNORMAL LOW (ref 3.87–5.11)
RDW: 13.3 % (ref 11.5–15.5)

## 2011-05-28 LAB — BASIC METABOLIC PANEL
BUN: 11 mg/dL (ref 6–23)
CO2: 24 mEq/L (ref 19–32)
Calcium: 8.2 mg/dL — ABNORMAL LOW (ref 8.4–10.5)
Creatinine, Ser: 0.63 mg/dL (ref 0.50–1.10)
GFR calc non Af Amer: >60 mL/min (ref >60)
Glucose, Bld: 107 mg/dL — ABNORMAL HIGH (ref 70–99)
Sodium: 140 mEq/L (ref 135–145)

## 2011-05-28 LAB — PREPARE RBC (CROSSMATCH)

## 2011-05-29 LAB — TYPE AND SCREEN: Unit division: 0

## 2011-05-30 LAB — HEMOGLOBIN AND HEMATOCRIT, BLOOD: Hemoglobin: 8.9 g/dL — ABNORMAL LOW (ref 12.0–15.0)

## 2011-05-30 NOTE — Discharge Summary (Signed)
NAMEBRIE, EPPARD NO.:  [id number]  MEDICAL RECORD NO.:  [id number]  LOCATION:  5021                         FACILITY:  [facility]  PHYSICIAN:  Standley Dakins, MD   DATE OF BIRTH:  1922-09-22  DATE OF ADMISSION:  05/25/2011 DATE OF DISCHARGE:                        DISCHARGE SUMMARY - REFERRING   ANTICIPATED DATE OF DISCHARGE:  May 31, 2011.  DISCHARGE DIAGNOSES: 1. Right hip fracture status post recent right hip hemiarthroplasty. 2. Status post remote left hip hemiarthroplasty. 3. Severe advanced dementia. 4. Anxiety disorder. 5. Anemia. 6. Osteoarthritis. 7. Hyperlipidemia. 8. Cerebrovascular disease and microvascular disease. 9. Escherichia coli urinary tract infection. 10.History of dermatitis.  DISCHARGE MEDICATIONS:  Please see discharge medication reconciliation form.  HOSPITAL COURSE:  Briefly, this patient is an 77 year old female resident of the [facility], who was sent to the emergency department with a chief complaint of fall.  Unfortunately, she had suffered an unwitnessed fall and appeared to be grabbing at her right thigh.  She is essentially nonverbal and has severe advanced dementia and was grabbing at the right thigh.  In the emergency department, x- rays revealed that the patient had a fracture at the right femoral neck and superolateral dislocation of the distal fracture component noted on the x-ray.  The femoral head remained located within the acetabulum. The patient was seen by the orthopedic consult, Dr. Ave Filter who performed right hip hemiarthroplasty on May 25, 2011.  The patient tolerated the procedure very well.  We monitored her closely and noted that she had a drop in hemoglobin to less than 7 and I transfused her with 1 unit of packed red blood cells.  After the transfusion, her hemoglobin has remained relatively stable with a current hemoglobin of 8.9.    The patient had received Ativan several times  for agitation and experienced confusion, but overall has done very well considering her advanced dementia in the hospital.  She has tried to work with physical therapy.  She has been ambulating, but her ambulation and progress is limited by her cognitive status and it hinders mobility in that regard. She is a fall risk and will remain a fall risk because of her cognitive status.  The rest of her hospitalization has been awaiting placement in skilled nursing facility.    She could not go back to [facility] because she required more care than they are able to provide for her at this time.    She will need to continue physical therapy at the skilled nursing facility and she will follow up with her orthopedic specialist, Dr. Ave Filter in 10-14 days after the procedure which was done on May 25, 2011.  DISCHARGE CONDITION:  Stable.  DISPOSITION:  Discharged to skilled nursing facility.  ACTIVITY:  Physical therapy recommended, fall precautions recommended, and dementia precautions.  DIET:  Resume previous diet.  FOLLOWUP:  Follow with Dr. Ave Filter in 10-14 days post procedure. Follow up with the attending physician at the skilled nursing facility.  I spent 40 mins preparing discharge including reviewing records and  consultation notes, arranging care with care managers and dictating.   Standley Dakins, MD     CJ/MEDQ  D:  05/30/2011  T:  05/30/2011  Job:  [id number]  Electronically Signed by Standley Dakins  on 05/30/2011 07:16:40 PM

## 2011-06-13 NOTE — Consult Note (Signed)
[name], [name] NO.:  [id number]  MEDICAL RECORD NO.:  [id number]  LOCATION:  5021                         FACILITY:  [facility]  PHYSICIAN:  Jones Broom, MD    DATE OF BIRTH:  [birth year]  DATE OF CONSULTATION:  05/25/2011 DATE OF DISCHARGE:                                CONSULTATION   REASON FOR CONSULTATION:  Evaluation of right hip fracture.  HISTORY OF PRESENT ILLNESS:  [name] is a severely demented 77 year old female who is a total care in nursing home.  She reportedly had a fall this morning and had increased complaint of right hip pain.  She was seen in the [facility] Emergency Department where x-rays revealed displaced right femoral neck fracture.  I was consulted for evaluation and management.  She has been admitted to [facility].  She is essentially unable to communicate given her severe dementia.  There is no report of other injuries with the fall.  She overall seems fairly comfortable at this point.  PAST MEDICAL HISTORY:  Reviewed in Dr. Henriette Combs preoperative H and P and includes anxiety disorder, anemia, dementia, dermatitis, osteoarthritis, hyperlipidemia, history of previous hip fracture, history of dysphagia, cerebrovascular disease, and microvascular disease.  MEDICATIONS: 1. Promethazine. 2. Ativan. 3. Hydrocodone. 4. Seroquel. 5. Multivitamins. 6. Lasix.  ALLERGIES:  NKDA.  FAMILY HISTORY:  Unable to obtain.  SOCIAL HISTORY:  She lives at [facility] and requires assistance with all daily activities.  She is severely demented.  REVIEW OF SYSTEMS:  Positive for confusion, dry mouth, otherwise, negative except as described above.  PHYSICAL EXAMINATION:  VITAL SIGNS:  Temperature 98.1, pulse 91, respirations 20, blood pressure 147/71, pulse ox 97% on room air. GENERAL:  She is awake and severely confused.  She is not able to communicate.  She is in no respiratory distress. HEENT:   Extraocular motion appears intact. EXTREMITIES:  Examination of bilateral upper extremities demonstrates no ecchymosis or swelling.  With gentle logroll of the right lower extremity, she seems to have some pain in the groin area.  She can wiggle her toes up and down.  Distally, she has capillary refill less than 2 seconds. NEUROLOGIC:  She is not responsive for appropriate neurologic exam for sensation.  DIAGNOSTIC STUDIES:  X-rays including 2-views of the right femur today demonstrate displaced femoral neck fracture.  No other fracture is noted.  Labs were reviewed.  X-rays from 2007 which demonstrate a displaced left femoral neck fracture.  I do not appreciate postoperative films on that side.  I do see an operative report describing left hip hemiarthroplasty from 2007.  IMPRESSION AND PLAN:  Right hip displaced femoral neck fracture.  PLAN:  She would benefit from right hip hemiarthroplasty to promote early ambulation and pain control.  I spoke with her daughter extensively on the phone about risks, benefits, and alternatives of surgery, and she would like to go ahead with the surgery.  We will keep n.p.o. for now.  Plan for surgery later today as long as she is cleared by Internal Medicine Service.  Plan will be for Coumadin postoperatively unless she is felt to be too high of  a fall risk and we could do SCDs and TED hose only with mechanical prophylaxis.     Jones Broom, MD     JC/MEDQ  D:  05/25/2011  T:  05/26/2011  Job:  [id number]  Electronically Signed by Jones Broom  on 06/13/2011 03:57:28 PM

## 2011-06-13 NOTE — Op Note (Signed)
[name], GILLYARD NO.:  [id number]  MEDICAL RECORD NO.:  [id number]  LOCATION:  5021                         FACILITY:  [facility]  PHYSICIAN:  Jones Broom, MD    DATE OF BIRTH:  05/18/1922  DATE OF PROCEDURE:  05/25/2011 DATE OF DISCHARGE:                              OPERATIVE REPORT   PREOPERATIVE DIAGNOSIS:  Right hip displaced femoral neck fracture.  POSTOPERATIVE DIAGNOSIS:  Right hip displaced femoral neck fracture.  PROCEDURE PERFORMED:  Right hip hemiarthroplasty.  ATTENDING SURGEON:  Jones Broom, MD  ASSISTANT:  None.  ANESTHESIA:  GETA.  COMPLICATIONS:  None.  DRAINS:  None.  SPECIMENS:  The femoral head was discarded.  ESTIMATED BLOOD LOSS:  200 mL.  INDICATIONS FOR SURGERY:  The patient is an 77 year old demented female who had a fall earlier this morning and suffered a right displaced femoral neck fracture.  She was indicated for operative treatment to promote early ambulation and prevent complications of bedrest and for pain control.  I spoke with her daughter in power of attorney over the telephone who understood risks, benefits and alternatives to surgery and wished to go ahead with surgery.  Informed consent was given.  OPERATIVE FINDINGS:  [place] cemented 5 stem with a 48 head was placed with excellent stability.  PROCEDURE IN DETAILS:  The patient was identified in the preoperative holding area where I personally marked the operative site after verifying site side and procedure with the patient.  She was taken back to the operating room where general anesthesia was induced without complication.  She was placed in a lateral decubitus position with the right side up.  She did receive preoperative IV antibiotics.  At the appropriate time-out, procedure was carried out verifying site side and procedure.  The right hip was then prepped and draped in a standard sterile fashion.  Approximately 12 cm incision was made  over the posterior aspect of the greater trochanter.  Dissection was carried down to the fascia which was split longitudinally in line with the incision. The short external rotators were identified and the sciatic nerve was identified and protected.  With progressive internal rotation, short external rotators and the capsule were taken off the posterior aspect of the greater trochanter exposing the femoral neck and the fracture.  The piriformis was tagged and the capsule and external rotators were split just inferior to the piriformis down to the level of the labrum.  The femoral neck cut was then made at the appropriate level in the appropriate inclination and the femoral head was removed in size.  The acetabulum was cleaned of any debris and round ligament.  The proximal femur was then prepared with sequential lateralizing and broaching up to a size 5 which was felt to be the appropriate size.  The trial 48 head was placed and reduced and felt to have excellent stability.  It was then re-dislocated and the broach was removed.  A cement restrictor was placed and the canal was prepared.  The implant was then cemented in and held in place until the cement was hardening cool.  The 48 head with 0 neck length was then impacted on a  Morse taper and the hip was reduced. Excellent stability was noted with 60 degrees internal rotation, 90 degrees flexion, 70 degrees internal rotation, 70 degrees flexion. Minimal shuck and no anterior instability.  It was not felt to be overly tight.  The joint was then copiously irrigated with pulse lavage and closed in layers with #1 Vicryl in the fascial layer, 2-0 Vicryl in deep dermal layer and staples for skin closure.  A light dressing was applied and the patient was placed in abduction brace.  She was then allowed to awaken from general anesthesia, transferred to the stretcher and taken to the recovery room in stable condition.  POSTOPERATIVE PLAN:  She  will be weightbearing as tolerated posterior hip precautions.  She will be in the abduction pillow while in bed and not with physical therapy.  Given her severe dementia and very high or fall risks, we will recommend SCDs and TED hose for DVT prophylaxis with no chemical anticoagulation but we will discuss this with the Medicine Primary Team.     Jones Broom, MD     JC/MEDQ  D:  05/25/2011  T:  05/26/2011  Job:  [id number]  Electronically Signed by Jones Broom  on 06/13/2011 03:57:31 PM

## 2011-06-14 NOTE — Discharge Summary (Signed)
  NAMEAHMYAH, GIDLEY                 ACCOUNT NO.:  [id number]  MEDICAL RECORD NO.:  [id number]  LOCATION:  5021                         FACILITY:  [facility]  PHYSICIAN:  Clydia Llano, MD       DATE OF BIRTH:  02/10/1922  DATE OF ADMISSION:  05/25/2011 DATE OF DISCHARGE:  05/31/2011                        DISCHARGE SUMMARY - REFERRING   ADDENDUM:  PRIMARY CARE PHYSICIAN:  Bryan Lemma. Ehinger, MD  This is an addendum to the discharge summary dictated yesterday by Dr. Standley Dakins.  DISCHARGE MEDICATIONS: 1. Ciprofloxacin 500 mg p.o. b.i.d. for 5 more days. 2. Ativan 0.5 mg every 8 hours as needed for anxiety. 3. Health shakes 1 can p.o. b.i.d. 4. Hydrocodone/APAP 5/500 mg 1 tablet every 6 hours as needed for     pain. 5. Lasix 20 mg p.o. daily. 6. Multivitamin therapeutic 1 tablet p.o. daily. 7. Promethazine 25 mg every 6 hours as needed for nausea. 8. Seroquel 25 mg p.o. nightly. 9. Vitamin C 500 mg p.o. b.i.d.  For detailed discharge summary, please refer to the discharge summary dictated yesterday by Dr. Standley Dakins.     Clydia Llano, MD     ME/MEDQ  D:  05/31/2011  T:  05/31/2011  Job:  [id number]  cc:   Bryan Lemma. Manus Gunning, M.D.  Electronically Signed by Clydia Llano  on 06/14/2011 08:00:03 PM

## 2011-08-30 LAB — DIFFERENTIAL
Basophils Absolute: 0
Basophils Relative: 0
Eosinophils Absolute: 0.1
Eosinophils Relative: 2
Monocytes Absolute: 0.6
Monocytes Relative: 10
Neutro Abs: 4.3

## 2011-08-30 LAB — I-STAT 8, (EC8 V) (CONVERTED LAB)
BUN: 27 — ABNORMAL HIGH
Bicarbonate: 28 — ABNORMAL HIGH
Chloride: 105
Glucose, Bld: 97
pCO2, Ven: 35.6 — ABNORMAL LOW
pH, Ven: 7.504 — ABNORMAL HIGH

## 2011-08-30 LAB — CBC
HCT: 35.9 — ABNORMAL LOW
Hemoglobin: 12.3
MCHC: 34.2
MCV: 94.3
RDW: 12.6

## 2014-10-11 ENCOUNTER — Encounter (HOSPITAL_COMMUNITY): Payer: Self-pay | Admitting: Emergency Medicine

## 2014-10-11 ENCOUNTER — Emergency Department (HOSPITAL_COMMUNITY)
Admission: EM | Admit: 2014-10-11 | Discharge: 2014-10-12 | Disposition: A | Discharge status: Home / Self Care | Payer: Medicare HMO | Attending: Emergency Medicine | Admitting: Emergency Medicine

## 2014-10-11 ENCOUNTER — Emergency Department (HOSPITAL_COMMUNITY): Payer: Medicare HMO

## 2014-10-11 DIAGNOSIS — S0083XA Contusion of other part of head, initial encounter: Secondary | ICD-10-CM | POA: Insufficient documentation

## 2014-10-11 DIAGNOSIS — Z862 Personal history of diseases of the blood and blood-forming organs and certain disorders involving the immune mechanism: Secondary | ICD-10-CM | POA: Insufficient documentation

## 2014-10-11 DIAGNOSIS — Y9389 Activity, other specified: Secondary | ICD-10-CM | POA: Insufficient documentation

## 2014-10-11 DIAGNOSIS — Z8701 Personal history of pneumonia (recurrent): Secondary | ICD-10-CM | POA: Diagnosis not present

## 2014-10-11 DIAGNOSIS — S8002XA Contusion of left knee, initial encounter: Secondary | ICD-10-CM | POA: Insufficient documentation

## 2014-10-11 DIAGNOSIS — Z8739 Personal history of other diseases of the musculoskeletal system and connective tissue: Secondary | ICD-10-CM | POA: Diagnosis not present

## 2014-10-11 DIAGNOSIS — Y998 Other external cause status: Secondary | ICD-10-CM | POA: Insufficient documentation

## 2014-10-11 DIAGNOSIS — E878 Other disorders of electrolyte and fluid balance, not elsewhere classified: Secondary | ICD-10-CM | POA: Insufficient documentation

## 2014-10-11 DIAGNOSIS — N390 Urinary tract infection, site not specified: Secondary | ICD-10-CM | POA: Insufficient documentation

## 2014-10-11 DIAGNOSIS — Z79899 Other long term (current) drug therapy: Secondary | ICD-10-CM | POA: Insufficient documentation

## 2014-10-11 DIAGNOSIS — W19XXXA Unspecified fall, initial encounter: Secondary | ICD-10-CM

## 2014-10-11 DIAGNOSIS — W06XXXA Fall from bed, initial encounter: Secondary | ICD-10-CM | POA: Diagnosis not present

## 2014-10-11 DIAGNOSIS — E870 Hyperosmolality and hypernatremia: Secondary | ICD-10-CM | POA: Diagnosis not present

## 2014-10-11 DIAGNOSIS — F419 Anxiety disorder, unspecified: Secondary | ICD-10-CM | POA: Diagnosis not present

## 2014-10-11 DIAGNOSIS — S0990XA Unspecified injury of head, initial encounter: Secondary | ICD-10-CM | POA: Diagnosis present

## 2014-10-11 DIAGNOSIS — J449 Chronic obstructive pulmonary disease, unspecified: Secondary | ICD-10-CM | POA: Diagnosis not present

## 2014-10-11 DIAGNOSIS — Y9289 Other specified places as the place of occurrence of the external cause: Secondary | ICD-10-CM | POA: Insufficient documentation

## 2014-10-11 DIAGNOSIS — K029 Dental caries, unspecified: Secondary | ICD-10-CM | POA: Diagnosis not present

## 2014-10-11 DIAGNOSIS — F0390 Unspecified dementia without behavioral disturbance: Secondary | ICD-10-CM | POA: Insufficient documentation

## 2014-10-11 HISTORY — DX: Anxiety disorder, unspecified: F41.9

## 2014-10-11 HISTORY — DX: Pneumonia, unspecified organism: J18.9

## 2014-10-11 HISTORY — DX: Unspecified osteoarthritis, unspecified site: M19.90

## 2014-10-11 HISTORY — DX: Chronic obstructive pulmonary disease, unspecified: J44.9

## 2014-10-11 HISTORY — DX: Anemia, unspecified: D64.9

## 2014-10-11 HISTORY — DX: Hyperlipidemia, unspecified: E78.5

## 2014-10-11 HISTORY — DX: Unspecified mental disorder due to known physiological condition: F09

## 2014-10-11 NOTE — ED Notes (Signed)
Bed: [id number] Expected date:  Expected time:  Means of arrival:  Comments: EMS 80yo F, rolled out of bed

## 2014-10-11 NOTE — ED Provider Notes (Signed)
CSN: [id number]     Arrival date & time 10/11/14  2158 History   First MD Initiated Contact with Patient 10/11/14 2215     Chief Complaint  Patient presents with  . Fall     (Consider location/radiation/quality/duration/timing/severity/associated sxs/prior Treatment) HPI  Level V caveat- pt is non verbal with dementia which has been her baseline per facility and chart review. The patient to the ED by EMS after falling out of bed and onto the tile floor. She has a hematoma to the left forehead, abrasion to left knee, no obvious deformities. Pt is cold to the touch and her temperature is 97.3.  Past Medical History  Diagnosis Date  . Anxiety   . Osteoarthrosis   . Pneumonia   . Anemia   . Organic brain syndrome   . COPD (chronic obstructive pulmonary disease)   . HLD (hyperlipidemia)    History reviewed. No pertinent past surgical history. Family History  Problem Relation Age of Onset  . Family history unknown: Yes   History  Substance Use Topics  . Smoking status: Unknown If Ever Smoked  . Smokeless tobacco: Not on file  . Alcohol Use: Not on file     Comment: unable to obtain d/t cognition. Pt nonverbal responsive.   OB History    No data available     Review of Systems   Level V caveat- pt is non verbal with dementia Allergies  Review of patient's allergies indicates no known allergies.  Home Medications   Prior to Admission medications   Medication Sig Start Date End Date Taking? Authorizing Provider  Cranberry 405 MG CAPS Take 2 capsules by mouth at bedtime.   Yes Historical Provider, MD  divalproex (DEPAKOTE SPRINKLE) 125 MG capsule Take 125 mg by mouth 2 (two) times daily.   Yes Historical Provider, MD  LORazepam (ATIVAN) 0.5 MG tablet Take 0.25 mg by mouth every 8 (eight) hours.   Yes Historical Provider, MD  LORazepam (ATIVAN) 0.5 MG tablet Take 0.5 mg by mouth every 12 (twelve) hours as needed for anxiety. For restlessness   Yes Historical Provider, MD    senna (SENOKOT) 8.6 MG tablet Take 1 tablet by mouth daily.   Yes Historical Provider, MD  vitamin D, CHOLECALCIFEROL, 400 UNITS tablet Take 400 Units by mouth daily.   Yes Historical Provider, MD  Zinc Oxide (SECURA EXTRA PROTECTIVE) 30.6 % CREA Apply 1 application topically 3 (three) times daily. Apply to buttocks after each bath and change of breif   Yes Historical Provider, MD   BP 118/73 mmHg  Pulse 78  Temp(Src) 97.3 F (36.3 C) (Rectal)  Resp 16  Ht 4\' 9"  (1.448 m)  Wt 125 lb (56.7 kg)  BMI 27.04 kg/m2  SpO2 100%  LMP  Physical Exam  Constitutional: She appears well-developed and well-nourished. No distress.  HENT:  Head: Head is with contusion. Head is without raccoon's eyes, without Battle's sign, without abrasion, without laceration, without right periorbital erythema and without left periorbital erythema.    Right Ear: Tympanic membrane normal.  Left Ear: Tympanic membrane normal.  Nose: Nose normal.  + widespread dental decay  Eyes: Pupils are equal, round, and reactive to light.  Cardiovascular: Normal rate.   Pulmonary/Chest: Effort normal and breath sounds normal.  Abdominal: Soft.  Musculoskeletal:       Legs: Neurological: She is alert.  Nursing note and vitals reviewed.     ED Course  Procedures (including critical care time) Labs Review Labs Reviewed - No  data to display  Imaging Review No results found.   EKG Interpretation None      MDM   Final diagnoses:  Fall   Pt is difficult exam due to dementia and flexion of knees hips and arms bilateral. Cold to touch on arrival but vitals signs are okay. She was given warm blankets. Per nurse, the patient passed urine and it had a foul odor to it. Urinalysis, CBC and BMP ordered for further evaluation.   CT head and neck are unremarkable. CBC has returned with no abnormalities. BMP and urinalysis pending.  At end of shift Antony MaduraKelly Humes PA-C will follow up on labs. Pt to go back to nursing facility  if no acute values are present.  Filed Vitals:   10/12/14 0048  BP: 121/70  Pulse: 72  Temp:   Resp: [address]18      Callaghan Laverdure G Alexandria Current, PA-C 10/12/14 0058  Enid SkeensJoshua M Zavitz, MD 10/14/14 [phone]

## 2014-10-11 NOTE — ED Notes (Addendum)
Pt arrived via EMS with report of falling out of bed onto tile floor. Noted hematoma to lt forehead, abrasion to lt knee/lower leg area and slight bruising to lt cheek area. EMS reported that facility stated that present mental status is pt's norm.

## 2014-10-12 LAB — BASIC METABOLIC PANEL
ANION GAP: 9 (ref 5–15)
ANION GAP: 8 (ref 5–15)
Anion gap: 12 (ref 5–15)
BUN: 35 mg/dL — ABNORMAL HIGH (ref 6–23)
BUN: 37 mg/dL — ABNORMAL HIGH (ref 6–23)
BUN: 40 mg/dL — AB (ref 6–23)
CALCIUM: 10 mg/dL (ref 8.4–10.5)
CHLORIDE: 117 meq/L — AB (ref 96–112)
CO2: 27 mEq/L (ref 19–32)
CO2: 27 meq/L (ref 19–32)
CO2: 27 meq/L (ref 19–32)
CREATININE: 0.59 mg/dL (ref 0.50–1.10)
CREATININE: 0.64 mg/dL (ref 0.50–1.10)
Calcium: 8.9 mg/dL (ref 8.4–10.5)
Calcium: 9 mg/dL (ref 8.4–10.5)
Chloride: 113 mEq/L — ABNORMAL HIGH (ref 96–112)
Chloride: 115 mEq/L — ABNORMAL HIGH (ref 96–112)
Creatinine, Ser: 0.6 mg/dL (ref 0.50–1.10)
GFR calc Af Amer: >90 mL/min (ref >90)
GFR calc Af Amer: >90 mL/min (ref >90)
GFR calc non Af Amer: 78 mL/min — ABNORMAL LOW (ref >90)
GFR calc non Af Amer: 78 mL/min — ABNORMAL LOW (ref >90)
GFR, EST AFRICAN AMERICAN: 88 mL/min — AB (ref >90)
GFR, EST NON AFRICAN AMERICAN: 76 mL/min — AB (ref >90)
Glucose, Bld: 100 mg/dL — ABNORMAL HIGH (ref 70–99)
Glucose, Bld: 107 mg/dL — ABNORMAL HIGH (ref 70–99)
Glucose, Bld: 98 mg/dL (ref 70–99)
POTASSIUM: 3.9 meq/L (ref 3.7–5.3)
Potassium: 4 mEq/L (ref 3.7–5.3)
Potassium: 4 mEq/L (ref 3.7–5.3)
SODIUM: 148 meq/L — AB (ref 137–147)
Sodium: 153 mEq/L — ABNORMAL HIGH (ref 137–147)
Sodium: 154 mEq/L — ABNORMAL HIGH (ref 137–147)

## 2014-10-12 LAB — URINALYSIS, ROUTINE W REFLEX MICROSCOPIC
BILIRUBIN URINE: NEGATIVE
Glucose, UA: NEGATIVE mg/dL
Hgb urine dipstick: NEGATIVE
KETONES UR: NEGATIVE mg/dL
Leukocytes, UA: NEGATIVE
NITRITE: POSITIVE — AB
PH: 6 (ref 5.0–8.0)
Protein, ur: NEGATIVE mg/dL
Specific Gravity, Urine: 1.025 (ref 1.005–1.030)
Urobilinogen, UA: 1 mg/dL (ref 0.0–1.0)

## 2014-10-12 LAB — CBC WITH DIFFERENTIAL/PLATELET
BASOS PCT: 0 % (ref 0–1)
Basophils Absolute: 0 10*3/uL (ref 0.0–0.1)
EOS ABS: 0.1 10*3/uL (ref 0.0–0.7)
Eosinophils Relative: 2 % (ref 0–5)
HCT: 39.7 % (ref 36.0–46.0)
HEMOGLOBIN: 13.3 g/dL (ref 12.0–15.0)
Lymphocytes Relative: 18 % (ref 12–46)
Lymphs Abs: 1.2 10*3/uL (ref 0.7–4.0)
MCH: 32.2 pg (ref 26.0–34.0)
MCHC: 33.5 g/dL (ref 30.0–36.0)
MCV: 96.1 fL (ref 78.0–100.0)
MONO ABS: 0.7 10*3/uL (ref 0.1–1.0)
MONOS PCT: 11 % (ref 3–12)
NEUTROS PCT: 69 % (ref 43–77)
Neutro Abs: 4.6 10*3/uL (ref 1.7–7.7)
Platelets: 184 10*3/uL (ref 150–400)
RBC: 4.13 MIL/uL (ref 3.87–5.11)
RDW: 12.8 % (ref 11.5–15.5)
WBC: 6.7 10*3/uL (ref 4.0–10.5)

## 2014-10-12 LAB — URINE MICROSCOPIC-ADD ON

## 2014-10-12 MED ORDER — DEXTROSE 5 % IV SOLN
Freq: Once | INTRAVENOUS | Status: AC
  Administered 2014-10-12: 1000 mL via INTRAVENOUS

## 2014-10-12 MED ORDER — SODIUM CHLORIDE 0.9 % IV BOLUS (SEPSIS)
1000.0000 mL | Freq: Once | INTRAVENOUS | Status: AC
  Administered 2014-10-12: 1000 mL via INTRAVENOUS

## 2014-10-12 MED ORDER — DEXTROSE 5 % IV SOLN
1.0000 g | Freq: Once | INTRAVENOUS | Status: AC
  Administered 2014-10-12: 1 g via INTRAVENOUS
  Filled 2014-10-12: qty 10

## 2014-10-12 MED ORDER — CEPHALEXIN 500 MG PO CAPS: 500.0000 mg | ORAL_CAPSULE | Freq: Three times a day (TID) | ORAL | Status: AC

## 2014-10-12 NOTE — Discharge Instructions (Signed)
Please follow the directions provided.  Be sure to follow-up with your primary care provider to ensure you are getting better.  Please take the antibiotic for the urinary tract infection until it is all gone. Don't hesitate to return for any new, worsening or concerning symptoms.     SEEK IMMEDIATE MEDICAL CARE IF:  You have severe back pain or lower abdominal pain.  You develop chills.  You have nausea or vomiting.  You have continued burning or discomfort with urination.

## 2014-10-12 NOTE — ED Notes (Addendum)
Called [facility] for transportation back to facility. 

## 2014-10-12 NOTE — ED Notes (Signed)
Resting quietly with eye closed. Easily arousable. Verbally responsive. Resp even and unlabored. ABC's intact. NAD noted.  

## 2014-10-12 NOTE — ED Notes (Signed)
Pt from [facility].

## 2014-10-12 NOTE — ED Provider Notes (Signed)
[id number] - Patient care assumed from Marlon Peliffany Greene, PA-C at shift change. Patient is a nonverbal demented patient presenting from a nursing home for a fall. Imaging negative. Labs pending at shift change which were reviewed and show hypernatremia and hypochloremia. Urinalysis also consistent with urinary tract infection. Patient treated with IV Rocephin as well as IV fluids. BMP was rechecked with no improvement in hyponatremia and hypochloremia. Case discussed with Dr. Toniann FailKakrakandy of [facility]. He recommends infusion of D5W at 200 mL/h with a recheck of labs in 3 hours, at 0800. Patient signed out to oncoming provider who will recheck labs and disposition appropriately. Patient will need Keflex Rx if able to be discharged.  Physical Exam  Constitutional: She appears well-developed and well-nourished. No distress.  Well and nontoxic/nonseptic appearing.  HENT:  Head: Normocephalic.  Cardiovascular: Normal rate, regular rhythm and intact distal pulses.   Pulmonary/Chest: Effort normal. No respiratory distress.  Respirations even and unlabored  Neurological: She is alert.  Speech is nonsensical. GCS 15.  Skin: She is not diaphoretic.  Nursing note and vitals reviewed.   Filed Vitals:   10/11/14 2319 10/12/14 0048 10/12/14 0152 10/12/14 0330  BP:  121/70 125/76 130/61  Pulse:  72 70 79  Temp: 97.3 F (36.3 C)   97.9 F (36.6 C)  TempSrc: Rectal   Oral  Resp:  18 18 18   Height:      Weight:      SpO2:  98% 99% 100%   Results for orders placed or performed during the hospital encounter of 10/11/14  Urinalysis, Routine w reflex microscopic  Result Value Ref Range   Color, Urine YELLOW YELLOW   APPearance CLOUDY (A) CLEAR   Specific Gravity, Urine 1.025 1.005 - 1.030   pH 6.0 5.0 - 8.0   Glucose, UA NEGATIVE NEGATIVE mg/dL   Hgb urine dipstick NEGATIVE NEGATIVE   Bilirubin Urine NEGATIVE NEGATIVE   Ketones, ur NEGATIVE NEGATIVE mg/dL   Protein, ur NEGATIVE NEGATIVE mg/dL   Urobilinogen, UA  1.0 0.0 - 1.0 mg/dL   Nitrite POSITIVE (A) NEGATIVE   Leukocytes, UA NEGATIVE NEGATIVE  CBC with Differential  Result Value Ref Range   WBC 6.7 4.0 - 10.5 K/uL   RBC 4.13 3.87 - 5.11 MIL/uL   Hemoglobin 13.3 12.0 - 15.0 g/dL   HCT 57.839.7 46.936.0 - 62.946.0 %   MCV 96.1 78.0 - 100.0 fL   MCH 32.2 26.0 - 34.0 pg   MCHC 33.5 30.0 - 36.0 g/dL   RDW 52.812.8 41.311.5 - 24.415.5 %   Platelets 184 150 - 400 K/uL   Neutrophils Relative % 69 43 - 77 %   Neutro Abs 4.6 1.7 - 7.7 K/uL   Lymphocytes Relative 18 12 - 46 %   Lymphs Abs 1.2 0.7 - 4.0 K/uL   Monocytes Relative 11 3 - 12 %   Monocytes Absolute 0.7 0.1 - 1.0 K/uL   Eosinophils Relative 2 0 - 5 %   Eosinophils Absolute 0.1 0.0 - 0.7 K/uL   Basophils Relative 0 0 - 1 %   Basophils Absolute 0.0 0.0 - 0.1 K/uL  Basic metabolic panel  Result Value Ref Range   Sodium 154 (H) 137 - 147 mEq/L   Potassium 3.9 3.7 - 5.3 mEq/L   Chloride 115 (H) 96 - 112 mEq/L   CO2 27 19 - 32 mEq/L   Glucose, Bld 107 (H) 70 - 99 mg/dL   BUN 40 (H) 6 - 23 mg/dL   Creatinine, Ser 0.100.64  0.50 - 1.10 mg/dL   Calcium 16.110.0 8.4 - 09.610.5 mg/dL   GFR calc non Af Amer 76 (L) >90 mL/min   GFR calc Af Amer 88 (L) >90 mL/min   Anion gap 12 5 - 15  Urine microscopic-add on  Result Value Ref Range   Squamous Epithelial / LPF RARE RARE   WBC, UA 3-6 <3 WBC/hpf   Bacteria, UA MANY (A) RARE  Basic metabolic panel  Result Value Ref Range   Sodium 153 (H) 137 - 147 mEq/L   Potassium 4.0 3.7 - 5.3 mEq/L   Chloride 117 (H) 96 - 112 mEq/L   CO2 27 19 - 32 mEq/L   Glucose, Bld 100 (H) 70 - 99 mg/dL   BUN 37 (H) 6 - 23 mg/dL   Creatinine, Ser 0.450.60 0.50 - 1.10 mg/dL   Calcium 8.9 8.4 - 40.910.5 mg/dL   GFR calc non Af Amer 78 (L) >90 mL/min   GFR calc Af Amer >90 >90 mL/min   Anion gap 9 5 - [address]15    Kalyani Maeda, PA-C 10/12/14 0444  Loren Raceravid Yelverton, MD 10/12/14 [phone]

## 2014-10-12 NOTE — ED Notes (Signed)
Called report to THA, RN at [facility] facility

## 2014-10-12 NOTE — ED Notes (Addendum)
Resting quietly with eye closed. Easily arousable. Verbally responsive. Resp even and unlabored. ABC's intact. NAD noted.  

## 2014-10-12 NOTE — ED Notes (Signed)
Resting quietly with eye closed. Easily arousable. Verbally responsive. Speech incoherent. Resp even and unlabored. ABC's intact. NAD noted.

## 2014-10-12 NOTE — ED Notes (Signed)
IV infused ABT without difficulty. Pt had no adverse reaction noted.

## 2014-10-12 NOTE — ED Notes (Signed)
Pt's IV fluids stopped, will draw blood off IV in 15 minutes.

## 2014-10-12 NOTE — ED Provider Notes (Signed)
6:10 AM: At shift change, hand-off report given by Antony MaduraKelly Humes, PA-C.  Plan includes infusion of D5W and re-draw BMP at 8am.  If labs normalized will DC back to nsg home with Keflex for UTI.   6:30 AM:  Pt resting without distress, will proceed with plan to re-check lab at 0800.  9:15 AM: Repeat BMP resulted and reviewed.  Discussed with Dr. Juleen ChinaKohut.  On re-eval, pt alert but only vocalizes sounds does not verbalize any words consistent with her hx of dementia. She is smiling and regards faces when spoken to.  She is in no acute distress and appears safe to be discharged back to her nsg facility.    Rhonda BattiestElizabeth Elizabeht Suto, NP 10/12/14 2234  Raeford RazorStephen Kohut, MD 10/14/14 [phone]

## 2014-10-20 ENCOUNTER — Emergency Department (HOSPITAL_COMMUNITY)
Admission: EM | Admit: 2014-10-20 | Discharge: 2014-10-20 | Disposition: A | Discharge status: Home / Self Care | Payer: Medicare HMO | Attending: Emergency Medicine | Admitting: Emergency Medicine

## 2014-10-20 ENCOUNTER — Emergency Department (HOSPITAL_COMMUNITY): Payer: Medicare HMO

## 2014-10-20 ENCOUNTER — Encounter (HOSPITAL_COMMUNITY): Payer: Self-pay | Admitting: Emergency Medicine

## 2014-10-20 DIAGNOSIS — S40022A Contusion of left upper arm, initial encounter: Secondary | ICD-10-CM | POA: Insufficient documentation

## 2014-10-20 DIAGNOSIS — S20219A Contusion of unspecified front wall of thorax, initial encounter: Secondary | ICD-10-CM | POA: Insufficient documentation

## 2014-10-20 DIAGNOSIS — Z720 Tobacco use: Secondary | ICD-10-CM | POA: Insufficient documentation

## 2014-10-20 DIAGNOSIS — Z792 Long term (current) use of antibiotics: Secondary | ICD-10-CM | POA: Diagnosis not present

## 2014-10-20 DIAGNOSIS — W1839XA Other fall on same level, initial encounter: Secondary | ICD-10-CM | POA: Diagnosis not present

## 2014-10-20 DIAGNOSIS — Y92129 Unspecified place in nursing home as the place of occurrence of the external cause: Secondary | ICD-10-CM | POA: Diagnosis not present

## 2014-10-20 DIAGNOSIS — J449 Chronic obstructive pulmonary disease, unspecified: Secondary | ICD-10-CM | POA: Diagnosis not present

## 2014-10-20 DIAGNOSIS — Y939 Activity, unspecified: Secondary | ICD-10-CM | POA: Diagnosis not present

## 2014-10-20 DIAGNOSIS — Z8639 Personal history of other endocrine, nutritional and metabolic disease: Secondary | ICD-10-CM | POA: Insufficient documentation

## 2014-10-20 DIAGNOSIS — Z79899 Other long term (current) drug therapy: Secondary | ICD-10-CM | POA: Diagnosis not present

## 2014-10-20 DIAGNOSIS — Y998 Other external cause status: Secondary | ICD-10-CM | POA: Diagnosis not present

## 2014-10-20 DIAGNOSIS — F0390 Unspecified dementia without behavioral disturbance: Secondary | ICD-10-CM | POA: Insufficient documentation

## 2014-10-20 DIAGNOSIS — W19XXXA Unspecified fall, initial encounter: Secondary | ICD-10-CM

## 2014-10-20 DIAGNOSIS — F419 Anxiety disorder, unspecified: Secondary | ICD-10-CM | POA: Diagnosis not present

## 2014-10-20 DIAGNOSIS — S299XXA Unspecified injury of thorax, initial encounter: Secondary | ICD-10-CM | POA: Diagnosis present

## 2014-10-20 DIAGNOSIS — Z8701 Personal history of pneumonia (recurrent): Secondary | ICD-10-CM | POA: Insufficient documentation

## 2014-10-20 NOTE — ED Notes (Signed)
Per EMS, pt from facility. Staff found pt on the mat next to her bed on her L side after hearing her bed alarm go off. Pt is reportedly at baseline of nonverbal dementia. Pt arrives with c-collar in place. Pt in NAD.

## 2014-10-20 NOTE — ED Notes (Signed)
Pt has left with [facility] to be transported back to the facility.

## 2014-10-20 NOTE — ED Notes (Signed)
Pt has returned from CT.  

## 2014-10-20 NOTE — ED Notes (Signed)
[facility] call for transport. 

## 2014-10-20 NOTE — ED Notes (Signed)
[facility] has arrived to transport the pt back to the facility.  

## 2014-10-20 NOTE — ED Notes (Signed)
Glick, MD at bedside.  

## 2014-10-20 NOTE — ED Provider Notes (Signed)
CSN: [id number]     Arrival date & time 10/20/14  0218 History   First MD Initiated Contact with Patient 10/20/14 [phone]     Chief Complaint  Patient presents with  . Fall     (Consider location/radiation/quality/duration/timing/severity/associated sxs/prior Treatment) Patient is a 80 y.o. female presenting with fall. The history is provided by the nursing home. The history is limited by the condition of the patient (Dementia).  Fall  She was reported to have been found on the mat next to her bed. No fall was witnessed and she has no complaints. However, patient is nonverbal at baseline. Staffstatesthathermentalstatusisatherbaseline.  Past Medical History  Diagnosis Date  . Anxiety   . Osteoarthrosis   . Pneumonia   . Anemia   . Organic brain syndrome   . COPD (chronic obstructive pulmonary disease)   . HLD (hyperlipidemia)    History reviewed. No pertinent past surgical history. Family History  Problem Relation Age of Onset  . Family history unknown: Yes   History  Substance Use Topics  . Smoking status: Unknown If Ever Smoked  . Smokeless tobacco: Not on file  . Alcohol Use: Not on file     Comment: unable to obtain d/t cognition. Pt nonverbal responsive.   OB History    No data available     Review of Systems  Unable to perform ROS: Dementia      Allergies  Review of patient's allergies indicates no known allergies.  Home Medications   Prior to Admission medications   Medication Sig Start Date End Date Taking? Authorizing Provider  cephALEXin (KEFLEX) 500 MG capsule Take 1 capsule (500 mg total) by mouth 3 (three) times daily. 10/12/14  Yes Harle Battiest, NP  Cranberry 405 MG CAPS Take 2 capsules by mouth at bedtime.   Yes Historical Provider, MD  divalproex (DEPAKOTE SPRINKLE) 125 MG capsule Take 125 mg by mouth 2 (two) times daily.   Yes Historical Provider, MD  LORazepam (ATIVAN) 0.5 MG tablet Take 0.25 mg by mouth every morning.    Yes Historical  Provider, MD  LORazepam (ATIVAN) 0.5 MG tablet Take 0.5 mg by mouth every 12 (twelve) hours as needed for anxiety. For restlessness   Yes Historical Provider, MD  senna (SENOKOT) 8.6 MG tablet Take 1 tablet by mouth daily.   Yes Historical Provider, MD  vitamin D, CHOLECALCIFEROL, 400 UNITS tablet Take 400 Units by mouth daily.   Yes Historical Provider, MD  Zinc Oxide (SECURA EXTRA PROTECTIVE) 30.6 % CREA Apply 1 application topically 3 (three) times daily. Apply to buttocks after each bath and change of breif   Yes Historical Provider, MD   BP 133/84 mmHg  Pulse 73  Resp 14  SpO2 99% Physical Exam  Nursing note and vitals reviewed.  80 year old female, resting comfortably and in no acute distress. Vital signs are normal. Oxygen saturation is 99%, which is normal. Head is normocephalic and atraumatic. PERRLA, EOMI. Oropharynx is clear. Neck is immobilized in a stiff cervical collar and is nontender without adenopathy or JVD. Back is nontender and there is no CVA tenderness. Lungs are clear without rales, wheezes, or rhonchi. Chest is nontender. There is ecchymosis in the left upper anterolateral chest. This is purplish in color and has appearance of a bruise that is 36-56 days old. There is no crepitus or deformity. Heart has regular rate and rhythm without murmur. Abdomen is soft, flat, nontender without masses or hepatosplenomegaly and peristalsis is normoactive. Extremities have no cyanosis or  edema, full range of motion is present. Most is noted on the chest does extend into the left upper arm. Skin is warm and dry without rash. Neurologic: She is awake and nonverbal, she does not follow commands, cranial nerves are intact, there are no motor or sensory deficits. Moderate cogwheel rigidity is present.  ED Course  Procedures (including critical care time)  Imaging Review Ct Head Wo Contrast  10/20/2014   CLINICAL DATA:  Patient found down, on the mat next to her bed, on her left side.  Question of head or cervical spine injury. Initial encounter.  EXAM: CT HEAD WITHOUT CONTRAST  CT CERVICAL SPINE WITHOUT CONTRAST  TECHNIQUE: Multidetector CT imaging of the head and cervical spine was performed following the standard protocol without intravenous contrast. Multiplanar CT image reconstructions of the cervical spine were also generated.  COMPARISON:  CT of the head and cervical spine performed 10/11/2017  FINDINGS: CT HEAD FINDINGS  There is no evidence of acute infarction, mass lesion, or intra- or extra-axial hemorrhage on CT.  Prominence of the ventricles and sulci reflects moderately severe cortical volume loss. Diffuse periventricular and subcortical white matter change likely reflects small vessel ischemic microangiopathy. Cerebellar atrophy is noted.  The brainstem and fourth ventricle are within normal limits. The basal ganglia are unremarkable in appearance. The cerebral hemispheres demonstrate grossly normal gray-white differentiation. No mass effect or midline shift is seen.  There is no evidence of fracture; visualized osseous structures are unremarkable in appearance. The orbits are within normal limits. Mucosal thickening is noted at the right maxillary sinus and at the left side of the sphenoid sinus. The remaining paranasal sinuses and mastoid air cells are well-aerated. No significant soft tissue abnormalities are seen.  CT CERVICAL SPINE FINDINGS  There is no evidence of acute fracture or subluxation. Vertebral bodies demonstrate normal height and alignment. Disc space narrowing is noted at C5-C6, with associated anterior and posterior disc osteophyte complexes. There is mild grade 1 anterolisthesis of C7 on T1, reflecting underlying facet disease. Prevertebral soft tissues are within normal limits.  The thyroid gland is unremarkable in appearance. The visualized lung apices are clear. No significant soft tissue abnormalities are seen. Prominent right mandibular dental caries are  noted.  IMPRESSION: 1. No evidence of traumatic intracranial injury or fracture. 2. No evidence of fracture or subluxation along the cervical spine. 3. Moderately severe cortical volume loss and diffuse small vessel ischemic microangiopathy. 4. Mild degenerative change noted along the lower cervical spine. 5. Prominent right mandibular dental caries noted. 6. Mucosal thickening at the right maxillary sinus and at the left side of the sphenoid sinus.   Electronically Signed   By: Roanna RaiderJeffery  Chang M.D.   On: 10/20/2014 04:53   Ct Cervical Spine Wo Contrast  10/20/2014   CLINICAL DATA:  Patient found down, on the mat next to her bed, on her left side. Question of head or cervical spine injury. Initial encounter.  EXAM: CT HEAD WITHOUT CONTRAST  CT CERVICAL SPINE WITHOUT CONTRAST  TECHNIQUE: Multidetector CT imaging of the head and cervical spine was performed following the standard protocol without intravenous contrast. Multiplanar CT image reconstructions of the cervical spine were also generated.  COMPARISON:  CT of the head and cervical spine performed 10/11/2017  FINDINGS: CT HEAD FINDINGS  There is no evidence of acute infarction, mass lesion, or intra- or extra-axial hemorrhage on CT.  Prominence of the ventricles and sulci reflects moderately severe cortical volume loss. Diffuse periventricular and subcortical white matter  change likely reflects small vessel ischemic microangiopathy. Cerebellar atrophy is noted.  The brainstem and fourth ventricle are within normal limits. The basal ganglia are unremarkable in appearance. The cerebral hemispheres demonstrate grossly normal gray-white differentiation. No mass effect or midline shift is seen.  There is no evidence of fracture; visualized osseous structures are unremarkable in appearance. The orbits are within normal limits. Mucosal thickening is noted at the right maxillary sinus and at the left side of the sphenoid sinus. The remaining paranasal sinuses and  mastoid air cells are well-aerated. No significant soft tissue abnormalities are seen.  CT CERVICAL SPINE FINDINGS  There is no evidence of acute fracture or subluxation. Vertebral bodies demonstrate normal height and alignment. Disc space narrowing is noted at C5-C6, with associated anterior and posterior disc osteophyte complexes. There is mild grade 1 anterolisthesis of C7 on T1, reflecting underlying facet disease. Prevertebral soft tissues are within normal limits.  The thyroid gland is unremarkable in appearance. The visualized lung apices are clear. No significant soft tissue abnormalities are seen. Prominent right mandibular dental caries are noted.  IMPRESSION: 1. No evidence of traumatic intracranial injury or fracture. 2. No evidence of fracture or subluxation along the cervical spine. 3. Moderately severe cortical volume loss and diffuse small vessel ischemic microangiopathy. 4. Mild degenerative change noted along the lower cervical spine. 5. Prominent right mandibular dental caries noted. 6. Mucosal thickening at the right maxillary sinus and at the left side of the sphenoid sinus.   Electronically Signed   By: Roanna RaiderJeffery  Chang M.D.   On: 10/20/2014 04:53     EKG Interpretation   Date/Time:  Tuesday October 20 2014 02:31:10 EST Ventricular Rate:  76 PR Interval:  152 QRS Duration: 153 QT Interval:  449 QTC Calculation: 505 R Axis:   -106 Text Interpretation:  Sinus rhythm  with  Sinus arrhythmia RBBB and LAFB  When compared with ECG of 05/25/2011, No significant change was found  Confirmed by [facility]  MD, Dinnis Rog ([id number]) on 10/20/2014 2:46:01 AM      MDM   Final diagnoses:  Fall  Fall at nursing home, initial encounter    Apparent fall without significant injury today. She is sent for CT of head and cervical spine. Apparent sequelae of recent fall with bruising to chest and left upper arm.  CT scans are unremarkable. Stiff c-collar is removed and she is discharged back to her  nursing care facility.  Dione Boozeavid Lateshia Schmoker, MD 10/20/14 [phone]

## 2014-10-20 NOTE — Discharge Instructions (Signed)

## 2015-02-10 IMAGING — CT CT CERVICAL SPINE W/O CM
2 of 3 series · 7 of 14 positions shown, 8 images · non-contrast
Comparison: 09/14/2005 head CT

CLINICAL DATA: Fall with left forehead hematoma.

EXAM:
CT HEAD WITHOUT CONTRAST
CT CERVICAL SPINE WITHOUT CONTRAST
TECHNIQUE: Multidetector CT imaging of the head and cervical spine was
performed following the standard protocol without intravenous
contrast. Multiplanar CT image reconstructions of the cervical spine
were also generated.

[Series 4: bone windows · axial · 0.43mm/px · z∈[+1570,+1645]mm · 3 of 51 slices shown, 4 images]
[im 13/51  soft-tissue]
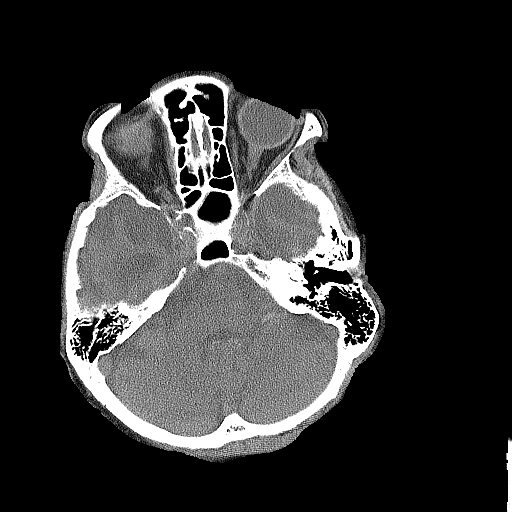
[im 13/51  bone]
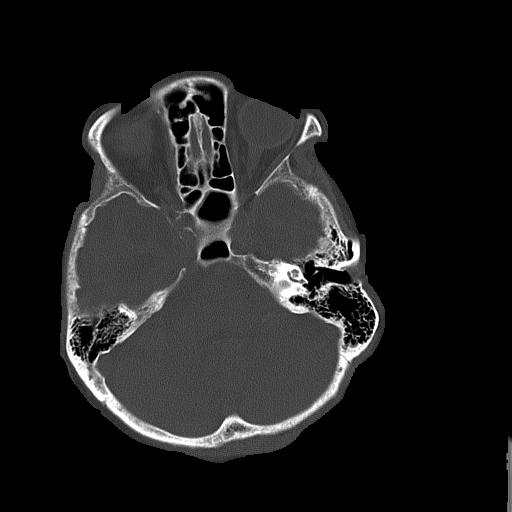
[im 26/51  bone]
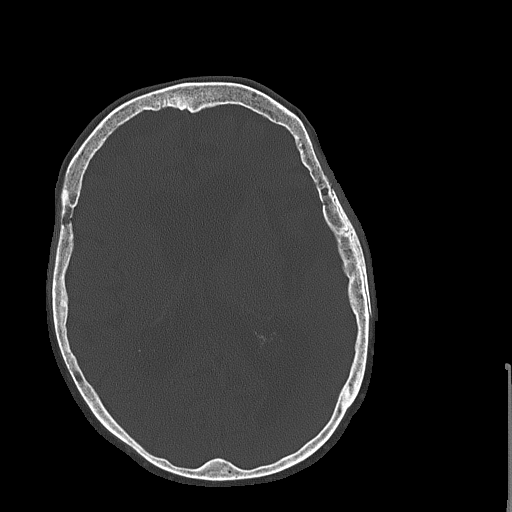
[im 38/51  bone]
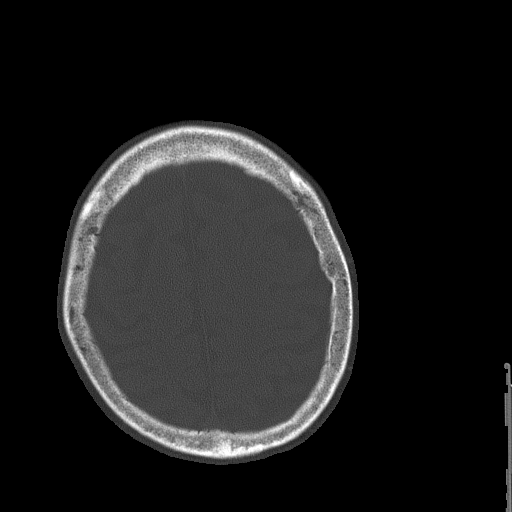

[Series 5: c-spine st · axial · 0.32mm/px · z∈[+1446,+1520]mm · 4 of 63 slices shown]
[im 13/63  bone]
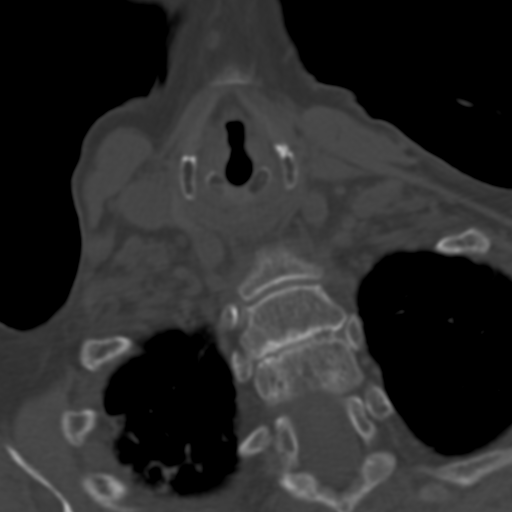
[im 25/63  bone]
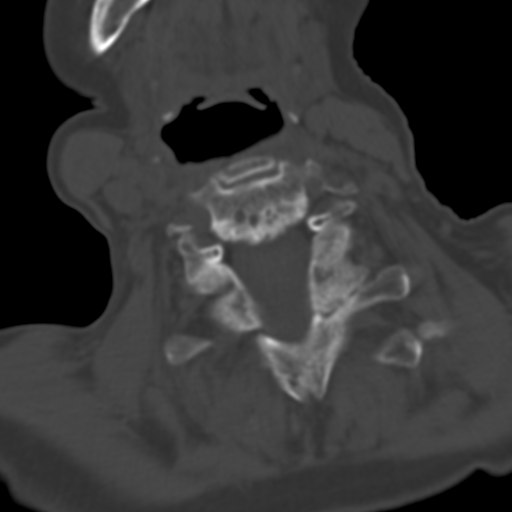
[im 38/63  bone]
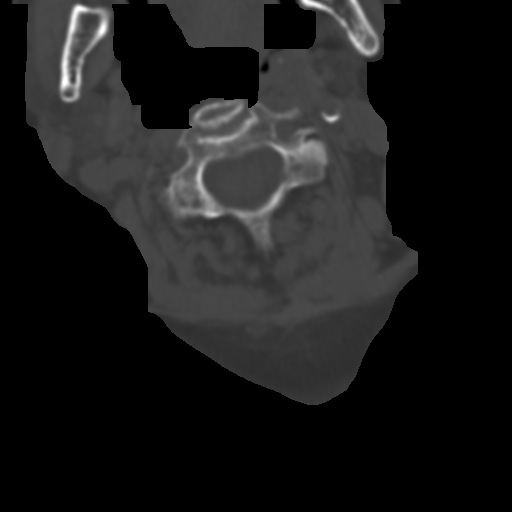
[im 50/63  bone]
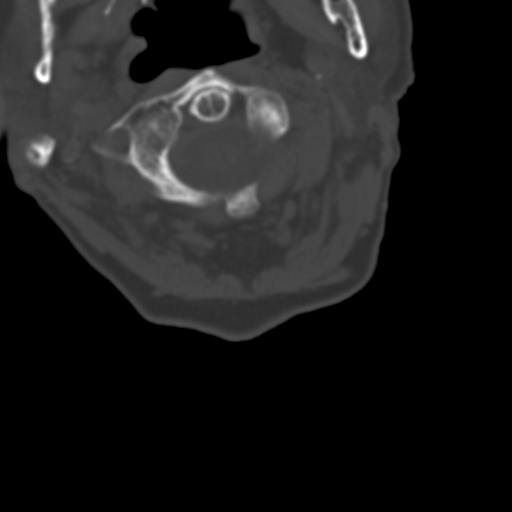

[7 of 14 positions shown; findings below may reference images not displayed]

FINDINGS: CT HEAD FINDINGS

Skull and Sinuses:No acute fracture or destructive process.

Mild mucosal thickening in the left sphenoid sinus with chronic
small polypoid structure along the intersinus septum.

Orbits: No acute abnormality.

Brain: No evidence of acute abnormality, such as acute infarction,
hemorrhage, hydrocephalus, or mass lesion/mass effect.

There is severe brain atrophy, with preferential involvement of the
temporal lobes, especially medially. This pattern can be seen with
Alzheimer's disease. There is also asymmetric widening of the left
sylvian fissure. The pattern of atrophy is similar to 3880, although
progressive.

There is chronic small vessel disease with ischemic gliosis best
visualized around the lateral ventricles.

CT CERVICAL SPINE FINDINGS

No evidence of acute fracture. 2 mm of C7-T1 anterolisthesis is
associated with facet osteoarthritis, the usual cause. No
prevertebral swelling or gross cervical canal hematoma.

Multilevel degenerative disc disease, focally advanced at C5-6 where
there is also osteophytic ridging. No significant osseous canal or
foraminal stenosis.
IMPRESSION: 1. No evidence of acute intracranial injury or cervical spine
fracture.
2. Mild C7-T1 anterolisthesis, usually degenerative.
3. Severe brain atrophy, progressive from 3880. Further description
above.

## 2015-02-10 IMAGING — CR DG KNEE COMPLETE 4+V*L*
4 series · 4 of 4 positions shown · non-contrast
Comparison: None.

CLINICAL DATA: Fall.  Knee abrasion.  Initial encounter

EXAM:
LEFT KNEE - COMPLETE 4+ VIEW

[x knee ap left (1 of 4)]
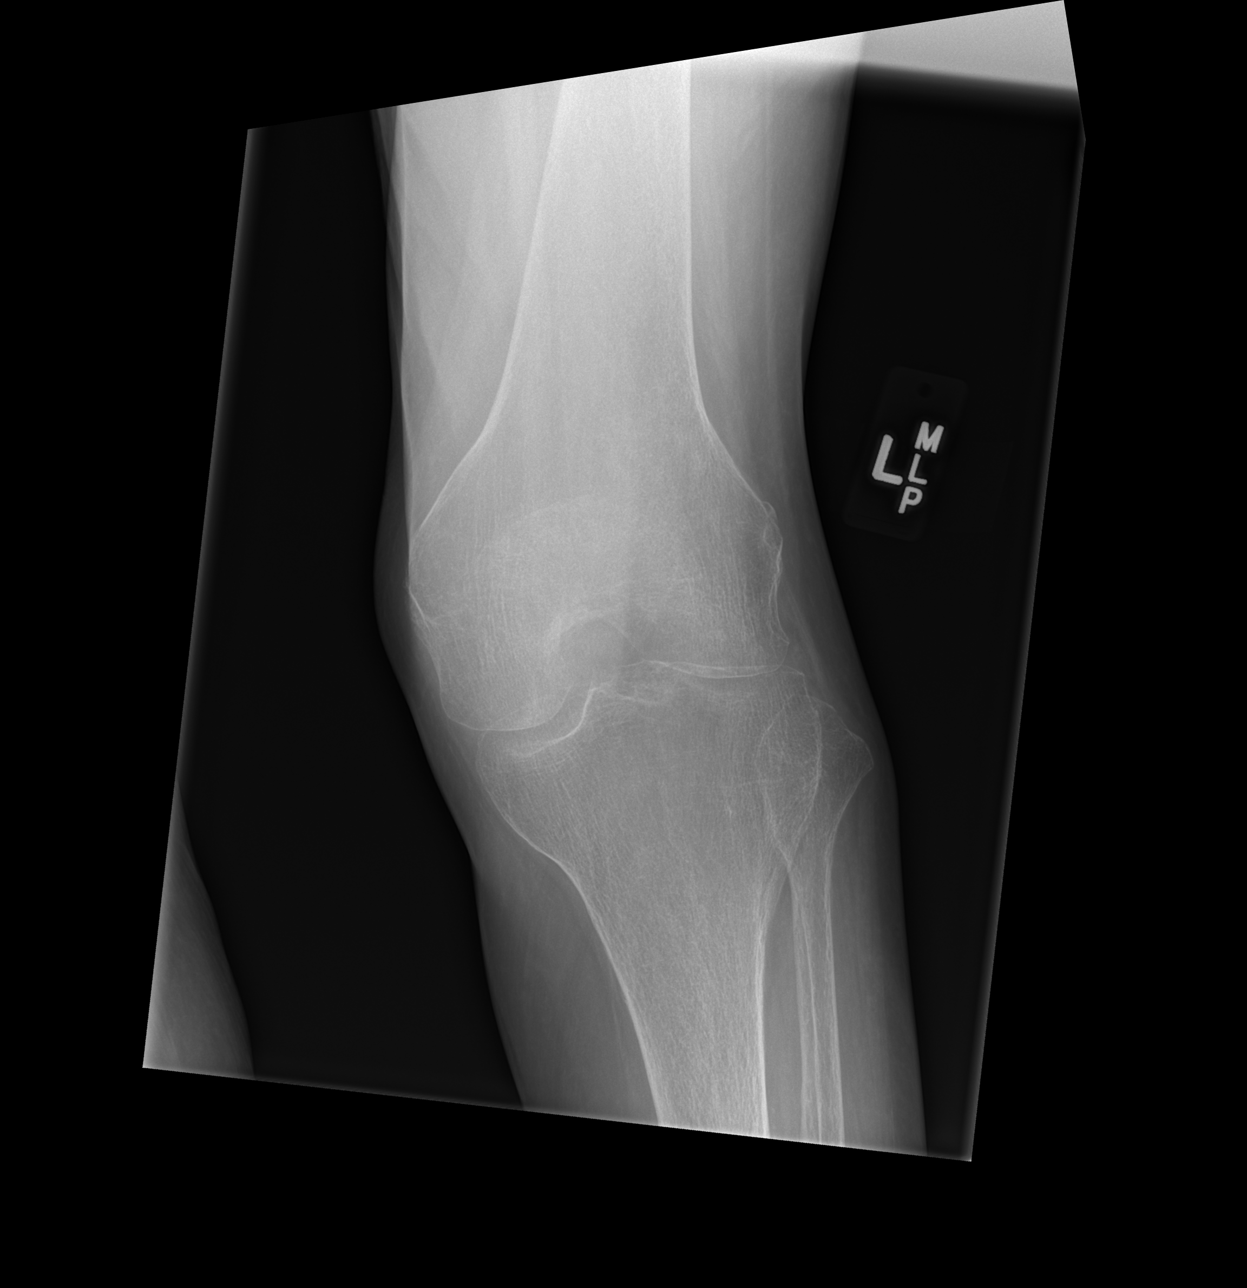

[x knee ap left (2 of 4)]
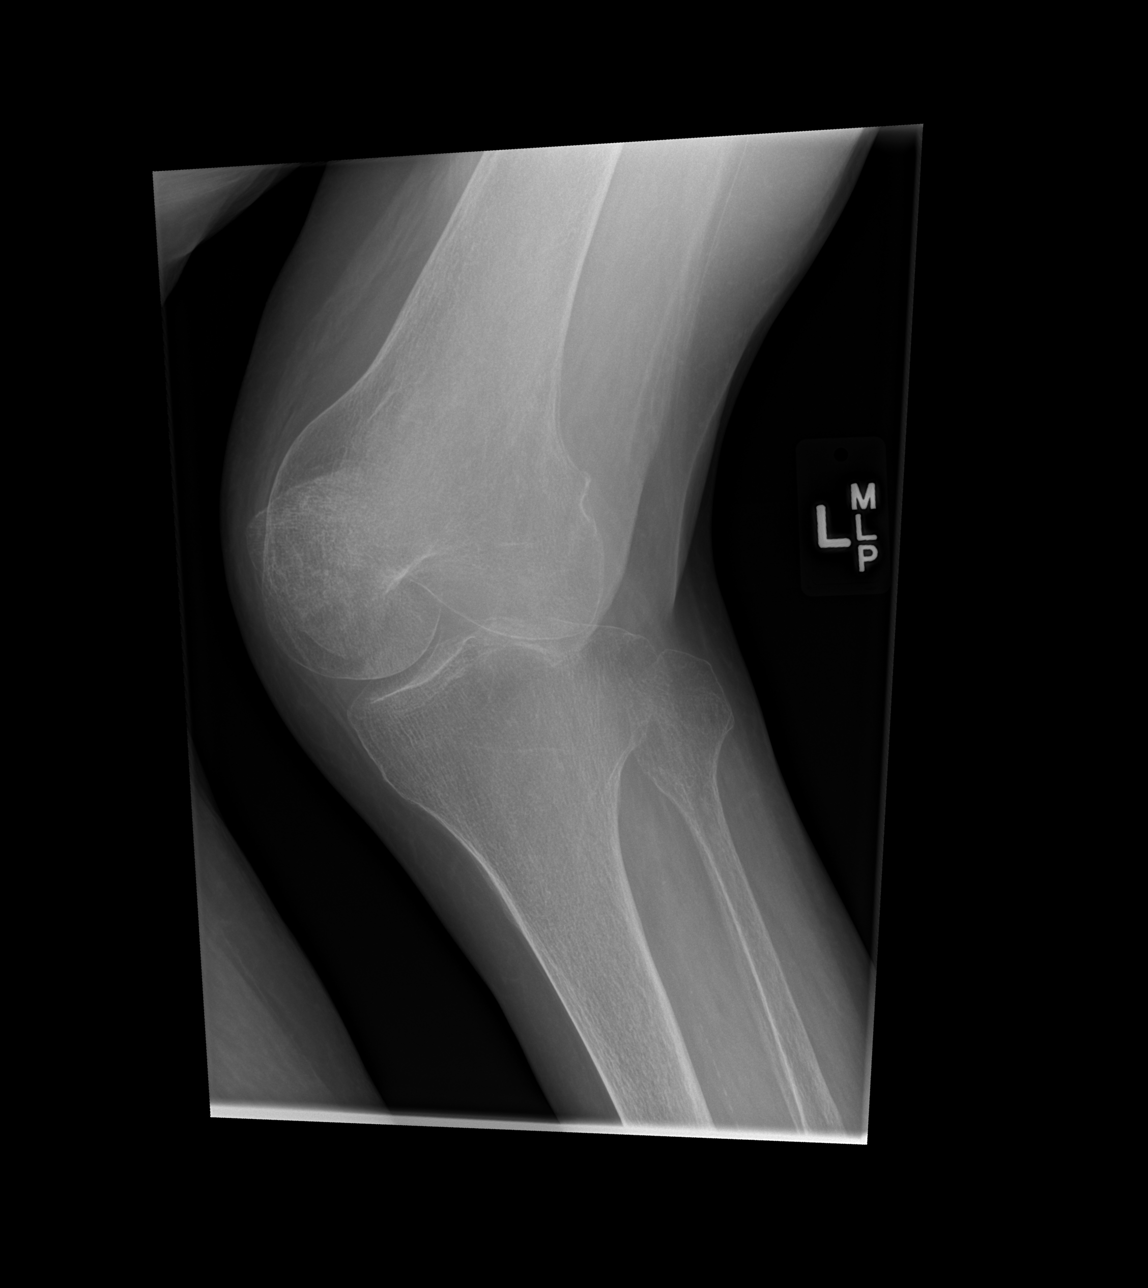

[x knee ap left (3 of 4)]
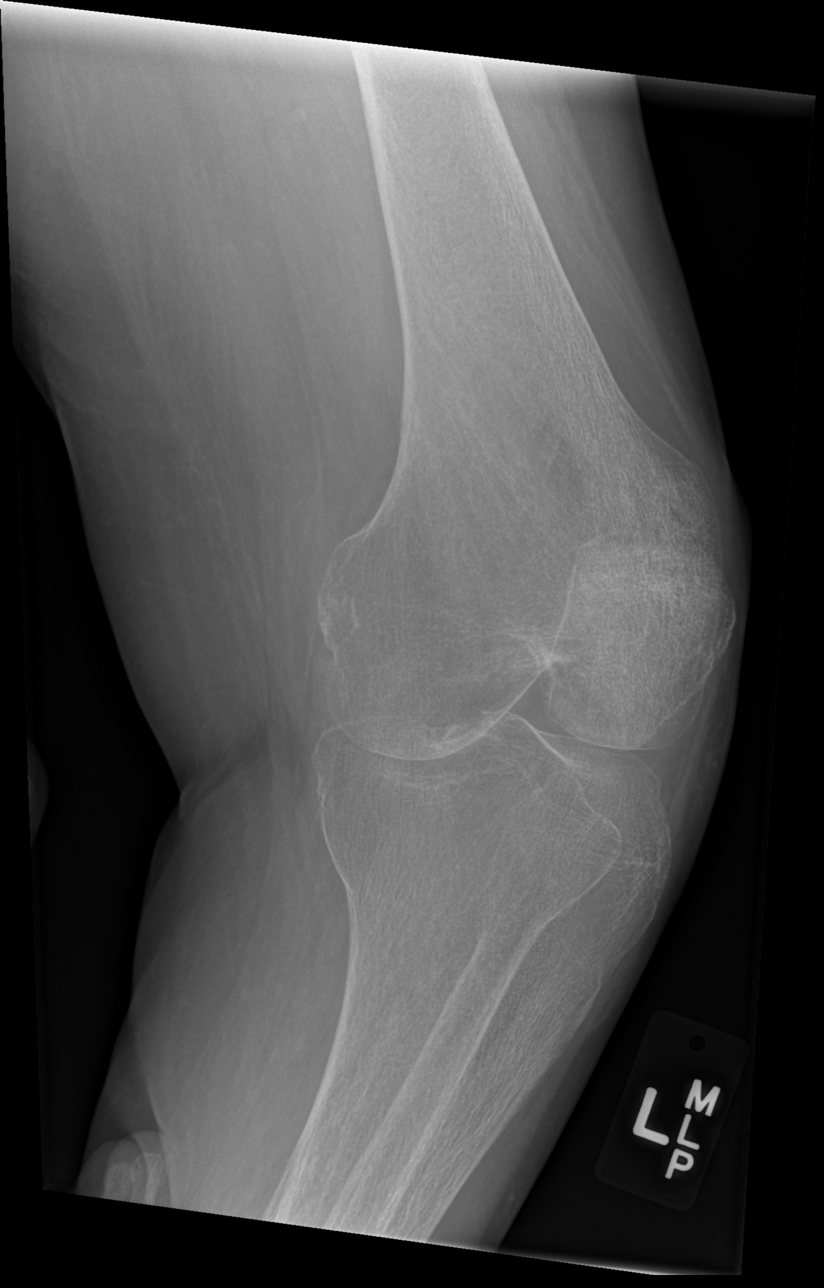

[x knee ap left (4 of 4)]
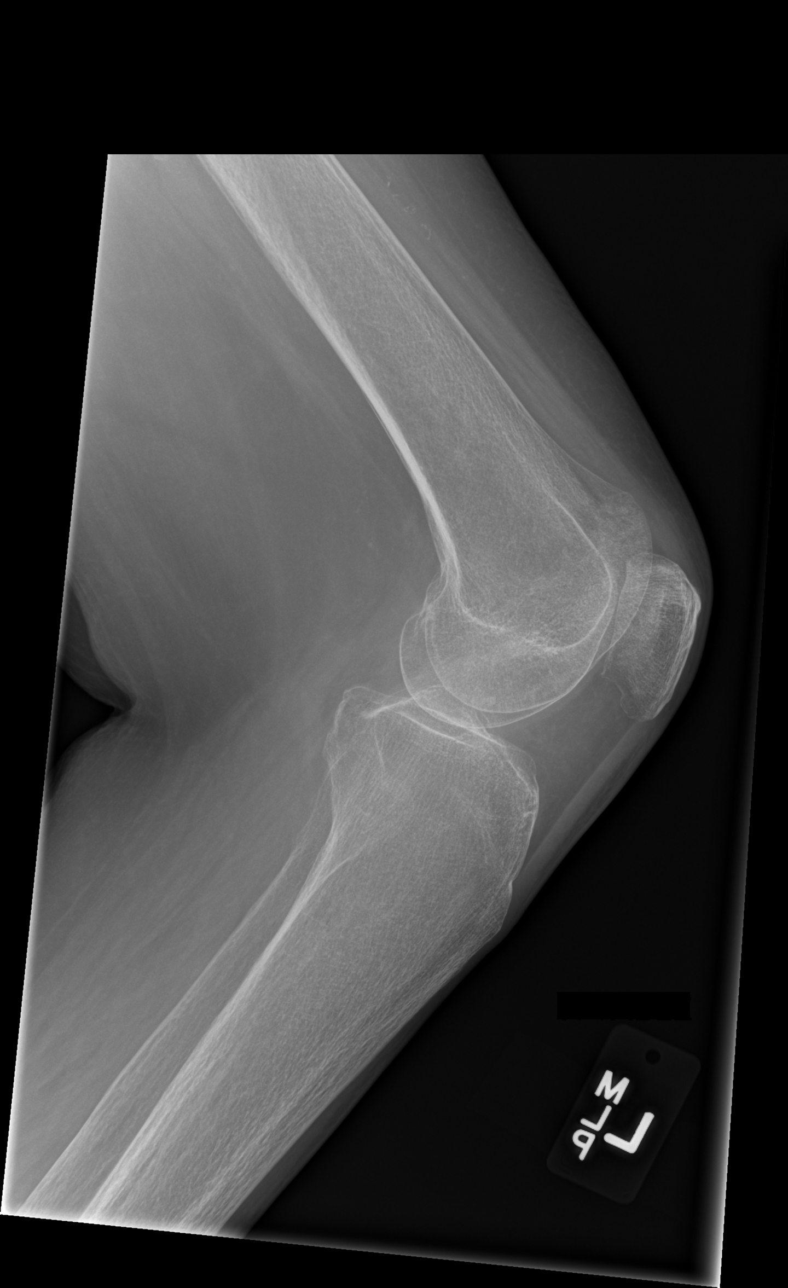

[4 of 4 positions shown; findings below may reference images not displayed]

FINDINGS: There is no evidence of fracture, dislocation, or joint effusion.
The bones are osteopenic. No significant degenerative change.
IMPRESSION: No acute osseous findings.

## 2015-06-23 DIAGNOSIS — I10 Essential (primary) hypertension: Secondary | ICD-10-CM | POA: Diagnosis not present

## 2015-06-23 DIAGNOSIS — Z79899 Other long term (current) drug therapy: Secondary | ICD-10-CM | POA: Diagnosis not present

## 2015-07-07 DIAGNOSIS — R251 Tremor, unspecified: Secondary | ICD-10-CM | POA: Diagnosis not present

## 2015-07-07 DIAGNOSIS — F0281 Dementia in other diseases classified elsewhere with behavioral disturbance: Secondary | ICD-10-CM | POA: Diagnosis not present

## 2015-07-07 DIAGNOSIS — K5901 Slow transit constipation: Secondary | ICD-10-CM | POA: Diagnosis not present

## 2015-07-07 DIAGNOSIS — N182 Chronic kidney disease, stage 2 (mild): Secondary | ICD-10-CM | POA: Diagnosis not present

## 2015-07-07 DIAGNOSIS — E559 Vitamin D deficiency, unspecified: Secondary | ICD-10-CM | POA: Diagnosis not present

## 2015-07-07 DIAGNOSIS — Z8781 Personal history of (healed) traumatic fracture: Secondary | ICD-10-CM | POA: Diagnosis not present

## 2015-07-07 DIAGNOSIS — N39498 Other specified urinary incontinence: Secondary | ICD-10-CM | POA: Diagnosis not present

## 2015-07-07 DIAGNOSIS — G301 Alzheimer's disease with late onset: Secondary | ICD-10-CM | POA: Diagnosis not present

## 2015-08-03 DIAGNOSIS — E559 Vitamin D deficiency, unspecified: Secondary | ICD-10-CM | POA: Diagnosis not present

## 2015-08-03 DIAGNOSIS — F39 Unspecified mood [affective] disorder: Secondary | ICD-10-CM | POA: Diagnosis not present

## 2015-08-03 DIAGNOSIS — K5901 Slow transit constipation: Secondary | ICD-10-CM | POA: Diagnosis not present

## 2015-08-03 DIAGNOSIS — Z993 Dependence on wheelchair: Secondary | ICD-10-CM | POA: Diagnosis not present

## 2015-08-03 DIAGNOSIS — M810 Age-related osteoporosis without current pathological fracture: Secondary | ICD-10-CM | POA: Diagnosis not present

## 2015-08-03 DIAGNOSIS — Z79899 Other long term (current) drug therapy: Secondary | ICD-10-CM | POA: Diagnosis not present

## 2015-08-03 DIAGNOSIS — Z87440 Personal history of urinary (tract) infections: Secondary | ICD-10-CM | POA: Diagnosis not present

## 2015-08-03 DIAGNOSIS — G20 Parkinson's disease: Secondary | ICD-10-CM | POA: Diagnosis not present

## 2015-09-29 DIAGNOSIS — G20 Parkinson's disease: Secondary | ICD-10-CM | POA: Diagnosis not present

## 2015-09-29 DIAGNOSIS — K5901 Slow transit constipation: Secondary | ICD-10-CM | POA: Diagnosis not present

## 2015-09-29 DIAGNOSIS — F39 Unspecified mood [affective] disorder: Secondary | ICD-10-CM | POA: Diagnosis not present

## 2015-09-29 DIAGNOSIS — G309 Alzheimer's disease, unspecified: Secondary | ICD-10-CM | POA: Diagnosis not present

## 2015-09-29 DIAGNOSIS — M810 Age-related osteoporosis without current pathological fracture: Secondary | ICD-10-CM | POA: Diagnosis not present

## 2015-09-29 DIAGNOSIS — R54 Age-related physical debility: Secondary | ICD-10-CM | POA: Diagnosis not present

## 2015-09-29 DIAGNOSIS — B379 Candidiasis, unspecified: Secondary | ICD-10-CM | POA: Diagnosis not present

## 2015-09-29 DIAGNOSIS — E559 Vitamin D deficiency, unspecified: Secondary | ICD-10-CM | POA: Diagnosis not present

## 2016-02-02 DIAGNOSIS — R635 Abnormal weight gain: Secondary | ICD-10-CM | POA: Diagnosis not present

## 2016-02-02 DIAGNOSIS — G309 Alzheimer's disease, unspecified: Secondary | ICD-10-CM | POA: Diagnosis not present

## 2016-02-02 DIAGNOSIS — S0083XA Contusion of other part of head, initial encounter: Secondary | ICD-10-CM | POA: Diagnosis not present

## 2016-02-02 DIAGNOSIS — M1990 Unspecified osteoarthritis, unspecified site: Secondary | ICD-10-CM | POA: Diagnosis not present

## 2016-02-02 DIAGNOSIS — S81801A Unspecified open wound, right lower leg, initial encounter: Secondary | ICD-10-CM | POA: Diagnosis not present

## 2016-02-16 DIAGNOSIS — Z79899 Other long term (current) drug therapy: Secondary | ICD-10-CM | POA: Diagnosis not present

## 2016-02-16 DIAGNOSIS — F419 Anxiety disorder, unspecified: Secondary | ICD-10-CM | POA: Diagnosis not present

## 2016-02-16 DIAGNOSIS — K5900 Constipation, unspecified: Secondary | ICD-10-CM | POA: Diagnosis not present

## 2016-02-16 DIAGNOSIS — N390 Urinary tract infection, site not specified: Secondary | ICD-10-CM | POA: Diagnosis not present

## 2016-02-16 DIAGNOSIS — R52 Pain, unspecified: Secondary | ICD-10-CM | POA: Diagnosis not present

## 2016-02-16 DIAGNOSIS — M2450 Contracture, unspecified joint: Secondary | ICD-10-CM | POA: Diagnosis not present

## 2016-03-27 DIAGNOSIS — N182 Chronic kidney disease, stage 2 (mild): Secondary | ICD-10-CM | POA: Diagnosis not present

## 2016-03-27 DIAGNOSIS — G309 Alzheimer's disease, unspecified: Secondary | ICD-10-CM | POA: Diagnosis not present

## 2016-03-27 DIAGNOSIS — R269 Unspecified abnormalities of gait and mobility: Secondary | ICD-10-CM | POA: Diagnosis not present

## 2016-03-27 DIAGNOSIS — J449 Chronic obstructive pulmonary disease, unspecified: Secondary | ICD-10-CM | POA: Diagnosis not present

## 2016-03-27 DIAGNOSIS — Z79899 Other long term (current) drug therapy: Secondary | ICD-10-CM | POA: Diagnosis not present

## 2016-03-27 DIAGNOSIS — F419 Anxiety disorder, unspecified: Secondary | ICD-10-CM | POA: Diagnosis not present

## 2016-04-13 DIAGNOSIS — B351 Tinea unguium: Secondary | ICD-10-CM | POA: Diagnosis not present

## 2016-04-24 DIAGNOSIS — B379 Candidiasis, unspecified: Secondary | ICD-10-CM | POA: Diagnosis not present

## 2016-04-24 DIAGNOSIS — K117 Disturbances of salivary secretion: Secondary | ICD-10-CM | POA: Diagnosis not present

## 2016-04-24 DIAGNOSIS — M1990 Unspecified osteoarthritis, unspecified site: Secondary | ICD-10-CM | POA: Diagnosis not present

## 2016-04-24 DIAGNOSIS — G20 Parkinson's disease: Secondary | ICD-10-CM | POA: Diagnosis not present

## 2016-06-22 DIAGNOSIS — B351 Tinea unguium: Secondary | ICD-10-CM | POA: Diagnosis not present

## 2016-12-21 DEATH — deceased
